# Patient Record
Sex: Male | Born: 1980 | Race: White | Hispanic: No | Marital: Married | Smoking: Never smoker
Health system: Southern US, Community
[De-identification: ages and names within clinical notes are randomized; demographics above are authoritative.]

## PROBLEM LIST (undated history)

## (undated) DIAGNOSIS — Z87442 Personal history of urinary calculi: Secondary | ICD-10-CM

## (undated) DIAGNOSIS — D369 Benign neoplasm, unspecified site: Secondary | ICD-10-CM

## (undated) DIAGNOSIS — F909 Attention-deficit hyperactivity disorder, unspecified type: Secondary | ICD-10-CM

## (undated) DIAGNOSIS — N529 Male erectile dysfunction, unspecified: Secondary | ICD-10-CM

## (undated) DIAGNOSIS — G4730 Sleep apnea, unspecified: Secondary | ICD-10-CM

## (undated) DIAGNOSIS — F32A Depression, unspecified: Secondary | ICD-10-CM

## (undated) DIAGNOSIS — I10 Essential (primary) hypertension: Secondary | ICD-10-CM

## (undated) DIAGNOSIS — M545 Low back pain: Secondary | ICD-10-CM

## (undated) DIAGNOSIS — J309 Allergic rhinitis, unspecified: Secondary | ICD-10-CM

## (undated) DIAGNOSIS — F319 Bipolar disorder, unspecified: Secondary | ICD-10-CM

## (undated) DIAGNOSIS — M5459 Other low back pain: Secondary | ICD-10-CM

## (undated) DIAGNOSIS — K5790 Diverticulosis of intestine, part unspecified, without perforation or abscess without bleeding: Secondary | ICD-10-CM

## (undated) DIAGNOSIS — F329 Major depressive disorder, single episode, unspecified: Secondary | ICD-10-CM

## (undated) DIAGNOSIS — N400 Enlarged prostate without lower urinary tract symptoms: Secondary | ICD-10-CM

## (undated) DIAGNOSIS — B019 Varicella without complication: Secondary | ICD-10-CM

## (undated) DIAGNOSIS — N200 Calculus of kidney: Secondary | ICD-10-CM

## (undated) DIAGNOSIS — K635 Polyp of colon: Secondary | ICD-10-CM

## (undated) DIAGNOSIS — K219 Gastro-esophageal reflux disease without esophagitis: Secondary | ICD-10-CM

## (undated) DIAGNOSIS — K449 Diaphragmatic hernia without obstruction or gangrene: Secondary | ICD-10-CM

## (undated) DIAGNOSIS — K649 Unspecified hemorrhoids: Secondary | ICD-10-CM

## (undated) DIAGNOSIS — F419 Anxiety disorder, unspecified: Secondary | ICD-10-CM

## (undated) HISTORY — DX: Polyp of colon: K63.5

## (undated) HISTORY — DX: Depression, unspecified: F32.A

## (undated) HISTORY — DX: Diaphragmatic hernia without obstruction or gangrene: K44.9

## (undated) HISTORY — DX: Anxiety disorder, unspecified: F41.9

## (undated) HISTORY — DX: Gastro-esophageal reflux disease without esophagitis: K21.9

## (undated) HISTORY — DX: Benign prostatic hyperplasia without lower urinary tract symptoms: N40.0

## (undated) HISTORY — DX: Benign neoplasm, unspecified site: D36.9

## (undated) HISTORY — DX: Essential (primary) hypertension: I10

## (undated) HISTORY — DX: Varicella without complication: B01.9

## (undated) HISTORY — DX: Unspecified hemorrhoids: K64.9

## (undated) HISTORY — DX: Bipolar disorder, unspecified: F31.9

## (undated) HISTORY — DX: Sleep apnea, unspecified: G47.30

## (undated) HISTORY — DX: Diverticulosis of intestine, part unspecified, without perforation or abscess without bleeding: K57.90

## (undated) HISTORY — DX: Low back pain: M54.5

## (undated) HISTORY — DX: Major depressive disorder, single episode, unspecified: F32.9

## (undated) HISTORY — PX: COLONOSCOPY: SHX174

## (undated) HISTORY — DX: Calculus of kidney: N20.0

## (undated) HISTORY — PX: OTHER SURGICAL HISTORY: SHX169

## (undated) HISTORY — DX: Male erectile dysfunction, unspecified: N52.9

## (undated) HISTORY — DX: Other low back pain: M54.59

## (undated) HISTORY — PX: TONSILLECTOMY: SUR1361

---

## 2004-12-13 ENCOUNTER — Emergency Department: Payer: Self-pay | Admitting: Internal Medicine

## 2006-05-16 ENCOUNTER — Encounter: Admission: RE | Admit: 2006-05-16 | Discharge: 2006-05-16 | Payer: Self-pay | Admitting: Gastroenterology

## 2007-03-12 ENCOUNTER — Ambulatory Visit: Payer: Self-pay | Admitting: Family Medicine

## 2012-08-19 ENCOUNTER — Ambulatory Visit: Payer: Self-pay | Admitting: Urology

## 2012-08-31 ENCOUNTER — Ambulatory Visit: Payer: Self-pay | Admitting: Urology

## 2012-09-02 ENCOUNTER — Ambulatory Visit: Payer: Self-pay | Admitting: Urology

## 2014-04-08 ENCOUNTER — Ambulatory Visit: Payer: Self-pay | Admitting: Family Medicine

## 2014-10-21 NOTE — Op Note (Signed)
PATIENT NAME:  Robert Carson, Robert Carson MR#:  [id number] DATE OF BIRTH:  June 27, 1981  DATE OF PROCEDURE:  09/02/2012  PREOPERATIVE DIAGNOSIS: Lower urinary tract symptoms.   POSTOPERATIVE DIAGNOSIS: Lower urinary tract symptoms.    PROCEDURE: Cystoscopy.   SURGEON: John Giovanni, MD   ASSISTANT: None.   ANESTHESIA: General.   INDICATIONS: The patient is a 34 year old male with bothersome lower urinary tract symptoms, primarily urinary hesitancy. He had no significant improvement with alpha blockade. He presents for cystoscopy to evaluate for the possibility of urethral stricture and requested this be performed under anesthesia.   DESCRIPTION OF PROCEDURE: He was taken to the operating room where a general anesthetic was administered. He was placed in the low lithotomy position, and his external genitalia were prepped and draped in the usual fashion. Timeout was performed. A 21-French  cystoscope with 30 degrees lens was lubricated and passed under direct vision. The urethra was normal in caliber without stricture. The prostate was nonocclusive and open with no lateral lobe enlargement noted. There were no inflammatory changes of the prostate noted. Panendoscopy was performed which was remarkable for normal-appearing bladder mucosa without erythema, solid or papillary lesions. The ureteral orifices were normal-appearing with clear efflux. The bladder was emptied and the cystoscope was removed. He was taken to the PACU in stable condition. There were no complications. EBL was 0.  ____________________________ Ronda Fairly. Bernardo Heater, MD scs:cb D: 09/02/2012 11:40:09 ET T: 09/02/2012 12:14:49 ET JOB#: [id number] cc: Nicki Reaper C. Bernardo Heater, MD, <Dictator> Abbie Sons MD ELECTRONICALLY SIGNED 09/10/2012 22:52

## 2016-10-31 ENCOUNTER — Other Ambulatory Visit: Payer: Self-pay | Admitting: *Deleted

## 2016-10-31 ENCOUNTER — Encounter: Payer: Self-pay | Admitting: *Deleted

## 2016-10-31 DIAGNOSIS — F419 Anxiety disorder, unspecified: Secondary | ICD-10-CM | POA: Insufficient documentation

## 2016-10-31 DIAGNOSIS — I10 Essential (primary) hypertension: Secondary | ICD-10-CM | POA: Insufficient documentation

## 2016-10-31 DIAGNOSIS — N200 Calculus of kidney: Secondary | ICD-10-CM | POA: Insufficient documentation

## 2016-10-31 DIAGNOSIS — K219 Gastro-esophageal reflux disease without esophagitis: Secondary | ICD-10-CM | POA: Insufficient documentation

## 2016-10-31 DIAGNOSIS — F329 Major depressive disorder, single episode, unspecified: Secondary | ICD-10-CM | POA: Insufficient documentation

## 2016-10-31 DIAGNOSIS — K449 Diaphragmatic hernia without obstruction or gangrene: Secondary | ICD-10-CM | POA: Insufficient documentation

## 2016-10-31 DIAGNOSIS — F32A Depression, unspecified: Secondary | ICD-10-CM | POA: Insufficient documentation

## 2016-11-03 NOTE — Progress Notes (Signed)
[facility] Consultation      Assessment and Plan:  The patient is 36 year old male with recurrent episodes of bronchitis, possibly secondary to aspiration.  Dysphagia. -Patient notes dysphagia to solids with choking episodes, this may be contributing to recurrent episodes of bronchitis. -He is referred back to his gastroenterologist for evaluation and possible EGD.  Acute bronchitis. -Currently asymptomatic. I suspect that his recurrent episodes of bronchitis may be secondary to above.  Hiatal hernia. -Continue PPI regularly.  Excessive daytime sleepiness with snoring. -Discussed possibility of sleep apnea, he has apparently had 2 negative sleep studies in the past, will consider repeat sleep study in the future if symptoms persist.  Date: 11/03/2016  MRN# [id number] LUIS NICKLES December 19, 1980  Referring Physician:   QUASHAUN LAZALDE is a 36 y.o. old male seen in consultation for chief complaint of:    Chief Complaint  Patient presents with  . Advice Only    Former Publishing rights manager patien: Sob, cough,pneumonia    HPI:   The patient is a 36 yo male, he has been seeing Dr. Raul Del. On review of outside records, it would appear that he had an episode of pneumonia which was seen on CXR, this resolved on Dr. Gust Brooms note in October of 2015, however he had a residual cough which was treated with breo and albuterol prn.   He notes that he gets episodes of cough a few times per year, he usually gets better with prednisone and/or abx. He does not find that there is any variation with seasons. He does note that it is worse with eating. He developed a pneumonia this past April as well as last year.   He does note that when he eats he feels that food gets trapped in his throat a lot, this can happen on a daily basis. This is worse when eating bread. He notes that he has to chew things well to make them go down and then has to drink a lot of water.  Several years ago he would have  stomach pain and possible IBS, he had a small bowel follow through, which showed small hiatal hernia without delay.  He had colonscopy on October 2015 and a polyp was found and he was recommended to have a repeat for monitoring.   He has been on inhaler in the past, he was on advair which helped with wheezing when he was having an episode.  He has had in in-lab and at [facility], which he tells me were negative.    He notes that he is sleepy during the day, he snores at night, and wakes in the am feeling tired and not rested.    PMHX:   Past Medical History:  Diagnosis Date  . Anxiety   . Bipolar 1 disorder ([place])   . Chickenpox   . Colon polyp   . Depression   . Diverticulosis   . Erectile dysfunction   . GERD (gastroesophageal reflux disease)   . Hemorrhoids   . Hiatal hernia   . Hypertension   . Kidney stones   . Mechanical low back pain   . Mild sleep apnea   . Tubular adenoma    Surgical Hx:  Past Surgical History:  Procedure Laterality Date  . COLONOSCOPY    . cystoscopy    . TONSILLECTOMY     Family Hx:  Family History  Problem Relation Age of Onset  . Skin cancer Mother   . GER disease Mother   . Skin cancer  Father    Social Hx:   Social History  Substance Use Topics  . Smoking status: Never Smoker  . Smokeless tobacco: Never Used  . Alcohol use No   Medication:   Reviewed;    Allergies:  Patient has no allergy information on record.  Review of Systems: Gen:  Denies  fever, sweats, chills HEENT: Denies blurred vision, double vision. bleeds, sore throat Cvc:  No dizziness, chest pain. Resp:   Denies cough or sputum production, shortness of breath Gi: Denies swallowing difficulty, stomach pain. Gu:  Denies bladder incontinence, burning urine Ext:   No Joint pain, stiffness. Skin: No skin rash,  hives  Endoc:  No polyuria, polydipsia. Psych: No depression, insomnia. Other:  All other systems were reviewed with the patient and were negative other that  what is mentioned in the HPI.   Physical Examination:   VS: BP 122/82 (BP Location: Left Arm, Patient Position: Sitting, Cuff Size: Normal)   Pulse 76   Resp 16   Ht 5\' 7"  (1.702 m)   Wt 210 lb (95.3 kg)   SpO2 100%   BMI 32.89 kg/m   General Appearance: No distress  Neuro:without focal findings,  speech normal,  HEENT: PERRLA, EOM intact.  [place] 3.  Pulmonary: normal breath sounds, No wheezing.  CardiovascularNormal S1,S2.  No m/r/g.   Abdomen: Benign, Soft, non-tender. Renal:  No costovertebral tenderness  GU:  No performed at this time. Endoc: No evident thyromegaly, no signs of acromegaly. Skin:   warm, no rashes, no ecchymosis  Extremities: normal, no cyanosis, clubbing.  Other findings:    LABORATORY PANEL:   CBC No results for input(s): WBC, HGB, HCT, PLT in the last 168 hours. ------------------------------------------------------------------------------------------------------------------  Chemistries  No results for input(s): NA, K, CL, CO2, GLUCOSE, BUN, CREATININE, CALCIUM, MG, AST, ALT, ALKPHOS, BILITOT in the last 168 hours.  Invalid input(s): GFRCGP ------------------------------------------------------------------------------------------------------------------  Cardiac Enzymes No results for input(s): TROPONINI in the last 168 hours. ------------------------------------------------------------  RADIOLOGY:  No results found.     Thank  you for the consultation and for allowing [facility] to assist in the care of your patient. Our recommendations are noted above.  Please contact us if we can be of further service.   Marda Stalker, MD.  Board Certified in Internal Medicine, Pulmonary Medicine, [place], and Sleep Medicine.  [facility] Office Number: [phone]  Patricia Pesa, M.D.  Merton Border, M.D  11/03/2016

## 2016-11-05 ENCOUNTER — Ambulatory Visit
Admission: RE | Admit: 2016-11-05 | Discharge: 2016-11-05 | Disposition: A | Discharge status: Home / Self Care | Payer: BLUE CROSS/BLUE SHIELD | Source: Ambulatory Visit | Attending: Internal Medicine | Admitting: Internal Medicine

## 2016-11-05 ENCOUNTER — Ambulatory Visit (INDEPENDENT_AMBULATORY_CARE_PROVIDER_SITE_OTHER): Payer: BLUE CROSS/BLUE SHIELD | Admitting: Internal Medicine

## 2016-11-05 ENCOUNTER — Other Ambulatory Visit
Admission: RE | Admit: 2016-11-05 | Discharge: 2016-11-05 | Disposition: A | Discharge status: Home / Self Care | Payer: BLUE CROSS/BLUE SHIELD | Source: Ambulatory Visit | Attending: Internal Medicine | Admitting: Internal Medicine

## 2016-11-05 ENCOUNTER — Encounter: Payer: Self-pay | Admitting: Internal Medicine

## 2016-11-05 VITALS — BP 122/82 | HR 76 | Resp 16 | Ht 67.0 in | Wt 210.0 lb

## 2016-11-05 DIAGNOSIS — R1310 Dysphagia, unspecified: Secondary | ICD-10-CM | POA: Diagnosis not present

## 2016-11-05 DIAGNOSIS — R05 Cough: Secondary | ICD-10-CM | POA: Diagnosis present

## 2016-11-05 DIAGNOSIS — J690 Pneumonitis due to inhalation of food and vomit: Secondary | ICD-10-CM

## 2016-11-05 DIAGNOSIS — R1319 Other dysphagia: Secondary | ICD-10-CM

## 2016-11-05 NOTE — Addendum Note (Signed)
Addended by: Devona Konig on: 11/05/2016 10:22 AM   Modules accepted: Orders

## 2016-11-05 NOTE — Patient Instructions (Addendum)
--  Will check serum immunoglobulins (IgG, IgM)  --Chest Xray.   --Refer to gastroenterology for dysphagia.

## 2016-11-06 LAB — MISC LABCORP TEST (SEND OUT)
LabCorp test name: 4
Labcorp test code: 209601

## 2016-11-06 LAB — IGM: IgM, Serum: 84 mg/dL (ref 20–172)

## 2016-12-10 ENCOUNTER — Other Ambulatory Visit: Payer: Self-pay | Admitting: Gastroenterology

## 2016-12-10 DIAGNOSIS — R1310 Dysphagia, unspecified: Secondary | ICD-10-CM

## 2016-12-13 ENCOUNTER — Ambulatory Visit: Payer: BLUE CROSS/BLUE SHIELD

## 2017-02-12 ENCOUNTER — Ambulatory Visit
Admission: RE | Admit: 2017-02-12 | Discharge: 2017-02-12 | Disposition: A | Discharge status: Home / Self Care | Payer: BLUE CROSS/BLUE SHIELD | Source: Ambulatory Visit | Attending: Gastroenterology | Admitting: Gastroenterology

## 2017-02-12 DIAGNOSIS — K449 Diaphragmatic hernia without obstruction or gangrene: Secondary | ICD-10-CM | POA: Insufficient documentation

## 2017-02-12 DIAGNOSIS — R1310 Dysphagia, unspecified: Secondary | ICD-10-CM | POA: Diagnosis present

## 2017-06-13 ENCOUNTER — Encounter: Payer: Self-pay | Admitting: *Deleted

## 2017-06-13 ENCOUNTER — Encounter
Admission: RE | Disposition: A | Discharge status: Home / Self Care | Payer: Self-pay | Source: Ambulatory Visit | Attending: Gastroenterology

## 2017-06-13 ENCOUNTER — Ambulatory Visit: Payer: BLUE CROSS/BLUE SHIELD | Admitting: Anesthesiology

## 2017-06-13 ENCOUNTER — Ambulatory Visit
Admission: RE | Admit: 2017-06-13 | Discharge: 2017-06-13 | Disposition: A | Discharge status: Home / Self Care | Payer: BLUE CROSS/BLUE SHIELD | Source: Ambulatory Visit | Attending: Gastroenterology | Admitting: Gastroenterology

## 2017-06-13 DIAGNOSIS — K449 Diaphragmatic hernia without obstruction or gangrene: Secondary | ICD-10-CM | POA: Diagnosis not present

## 2017-06-13 DIAGNOSIS — K5790 Diverticulosis of intestine, part unspecified, without perforation or abscess without bleeding: Secondary | ICD-10-CM | POA: Insufficient documentation

## 2017-06-13 DIAGNOSIS — I10 Essential (primary) hypertension: Secondary | ICD-10-CM | POA: Diagnosis not present

## 2017-06-13 DIAGNOSIS — F319 Bipolar disorder, unspecified: Secondary | ICD-10-CM | POA: Insufficient documentation

## 2017-06-13 DIAGNOSIS — R1310 Dysphagia, unspecified: Secondary | ICD-10-CM | POA: Diagnosis not present

## 2017-06-13 DIAGNOSIS — G4730 Sleep apnea, unspecified: Secondary | ICD-10-CM | POA: Diagnosis not present

## 2017-06-13 DIAGNOSIS — K2210 Ulcer of esophagus without bleeding: Secondary | ICD-10-CM | POA: Diagnosis not present

## 2017-06-13 DIAGNOSIS — Z539 Procedure and treatment not carried out, unspecified reason: Secondary | ICD-10-CM | POA: Diagnosis not present

## 2017-06-13 DIAGNOSIS — Z87442 Personal history of urinary calculi: Secondary | ICD-10-CM | POA: Insufficient documentation

## 2017-06-13 DIAGNOSIS — F419 Anxiety disorder, unspecified: Secondary | ICD-10-CM | POA: Diagnosis not present

## 2017-06-13 DIAGNOSIS — E669 Obesity, unspecified: Secondary | ICD-10-CM | POA: Diagnosis not present

## 2017-06-13 DIAGNOSIS — Z6835 Body mass index (BMI) 35.0-35.9, adult: Secondary | ICD-10-CM | POA: Diagnosis not present

## 2017-06-13 DIAGNOSIS — K210 Gastro-esophageal reflux disease with esophagitis: Secondary | ICD-10-CM | POA: Diagnosis not present

## 2017-06-13 DIAGNOSIS — Z86010 Personal history of colonic polyps: Secondary | ICD-10-CM | POA: Insufficient documentation

## 2017-06-13 DIAGNOSIS — Z79899 Other long term (current) drug therapy: Secondary | ICD-10-CM | POA: Diagnosis not present

## 2017-06-13 DIAGNOSIS — M545 Low back pain: Secondary | ICD-10-CM | POA: Diagnosis not present

## 2017-06-13 DIAGNOSIS — Z1211 Encounter for screening for malignant neoplasm of colon: Secondary | ICD-10-CM | POA: Diagnosis not present

## 2017-06-13 HISTORY — PX: ESOPHAGOGASTRODUODENOSCOPY (EGD) WITH PROPOFOL: SHX5813

## 2017-06-13 HISTORY — PX: COLONOSCOPY WITH PROPOFOL: SHX5780

## 2017-06-13 SURGERY — COLONOSCOPY WITH PROPOFOL
Anesthesia: General

## 2017-06-13 MED ORDER — SODIUM CHLORIDE 0.9 % IV SOLN
INTRAVENOUS | Status: DC
  Administered 2017-06-13: 10:00:00 via INTRAVENOUS

## 2017-06-13 MED ORDER — FENTANYL CITRATE (PF) 100 MCG/2ML IJ SOLN
INTRAMUSCULAR | Status: AC
  Filled 2017-06-13: qty 2

## 2017-06-13 MED ORDER — LIDOCAINE HCL (PF) 2 % IJ SOLN
INTRAMUSCULAR | Status: DC | PRN
  Administered 2017-06-13: 100 mg

## 2017-06-13 MED ORDER — SODIUM CHLORIDE 0.9 % IV SOLN: INTRAVENOUS | Status: DC

## 2017-06-13 MED ORDER — GLYCOPYRROLATE 0.2 MG/ML IJ SOLN
INTRAMUSCULAR | Status: DC | PRN
  Administered 2017-06-13: 0.2 mg via INTRAVENOUS

## 2017-06-13 MED ORDER — FENTANYL CITRATE (PF) 100 MCG/2ML IJ SOLN
INTRAMUSCULAR | Status: DC | PRN
  Administered 2017-06-13 (×2): 50 ug via INTRAVENOUS

## 2017-06-13 MED ORDER — MIDAZOLAM HCL 5 MG/5ML IJ SOLN
INTRAMUSCULAR | Status: DC | PRN
  Administered 2017-06-13 (×2): 2 mg via INTRAVENOUS

## 2017-06-13 MED ORDER — MIDAZOLAM HCL 2 MG/2ML IJ SOLN
INTRAMUSCULAR | Status: AC
  Filled 2017-06-13: qty 2

## 2017-06-13 MED ORDER — PROPOFOL 500 MG/50ML IV EMUL
INTRAVENOUS | Status: AC
  Filled 2017-06-13: qty 50

## 2017-06-13 MED ORDER — LIDOCAINE HCL (PF) 2 % IJ SOLN
INTRAMUSCULAR | Status: AC
  Filled 2017-06-13: qty 10

## 2017-06-13 MED ORDER — PROPOFOL 10 MG/ML IV BOLUS
INTRAVENOUS | Status: DC | PRN
  Administered 2017-06-13: 20 mg via INTRAVENOUS
  Administered 2017-06-13: 30 mg via INTRAVENOUS

## 2017-06-13 MED ORDER — PROPOFOL 500 MG/50ML IV EMUL
INTRAVENOUS | Status: DC | PRN
  Administered 2017-06-13: 50 ug/kg/min via INTRAVENOUS

## 2017-06-13 MED ORDER — GLYCOPYRROLATE 0.2 MG/ML IJ SOLN
INTRAMUSCULAR | Status: AC
  Filled 2017-06-13: qty 1

## 2017-06-13 NOTE — Anesthesia Postprocedure Evaluation (Signed)
Anesthesia Post Note  Patient: Robert Carson  Procedure(s) Performed: COLONOSCOPY WITH PROPOFOL (N/A ) ESOPHAGOGASTRODUODENOSCOPY (EGD) WITH PROPOFOL (N/A )  Patient location during evaluation: Endoscopy Anesthesia Type: General Level of consciousness: awake and alert and oriented Pain management: pain level controlled Vital Signs Assessment: post-procedure vital signs reviewed and stable Respiratory status: spontaneous breathing, nonlabored ventilation and respiratory function stable Cardiovascular status: blood pressure returned to baseline and stable Postop Assessment: no signs of nausea or vomiting Anesthetic complications: no     Last Vitals:  Vitals:   06/13/17 1046 06/13/17 1056  BP: 128/80 121/83  Pulse: 78 78  Resp: 15 20  Temp:    SpO2: 95% 94%    Last Pain:  Vitals:   06/13/17 1026  TempSrc: Tympanic                 Jerriann Schrom

## 2017-06-13 NOTE — Anesthesia Post-op Follow-up Note (Signed)
Anesthesia QCDR form completed.        

## 2017-06-13 NOTE — Anesthesia Preprocedure Evaluation (Signed)
Anesthesia Evaluation  Patient identified by MRN, date of birth, ID band Patient awake    Reviewed: Allergy & Precautions, NPO status , Patient's Chart, lab work & pertinent test results  History of Anesthesia Complications Negative for: history of anesthetic complications  Airway Mallampati: III  TM Distance: >3 FB Neck ROM: Full    Dental no notable dental hx.    Pulmonary neg sleep apnea, neg COPD,    breath sounds clear to auscultation- rhonchi (-) wheezing      Cardiovascular hypertension, Pt. on medications (-) CAD, (-) Past MI and (-) Cardiac Stents  Rhythm:Regular Rate:Normal - Systolic murmurs and - Diastolic murmurs    Neuro/Psych PSYCHIATRIC DISORDERS Anxiety Depression Bipolar Disorder negative neurological ROS     GI/Hepatic Neg liver ROS, hiatal hernia, GERD  ,  Endo/Other  negative endocrine ROSneg diabetes  Renal/GU Renal disease: hx of nephrolithiasis.     Musculoskeletal negative musculoskeletal ROS (+)   Abdominal (+) + obese,   Peds  Hematology negative hematology ROS (+)   Anesthesia Other Findings Past Medical History: No date: Anxiety No date: Bipolar 1 disorder (HCC) No date: Chickenpox No date: Colon polyp No date: Depression No date: Diverticulosis No date: Erectile dysfunction No date: GERD (gastroesophageal reflux disease) No date: Hemorrhoids No date: Hiatal hernia No date: Hypertension No date: Kidney stones No date: Mechanical low back pain No date: Mild sleep apnea No date: Tubular adenoma   Reproductive/Obstetrics                             Anesthesia Physical Anesthesia Plan  ASA: II  Anesthesia Plan: General   Post-op Pain Management:    Induction: Intravenous  PONV Risk Score and Plan: 1 and Propofol infusion  Airway Management Planned: Natural Airway  Additional Equipment:   Intra-op Plan:   Post-operative Plan:   Informed  Consent: I have reviewed the patients History and Physical, chart, labs and discussed the procedure including the risks, benefits and alternatives for the proposed anesthesia with the patient or authorized representative who has indicated his/her understanding and acceptance.   Dental advisory given  Plan Discussed with: CRNA and Anesthesiologist  Anesthesia Plan Comments:         Anesthesia Quick Evaluation

## 2017-06-13 NOTE — H&P (Signed)
Outpatient short stay form Pre-procedure 06/13/2017 9:43 AM [name] MD  Primary Physician: Jackolyn Confer MD  Reason for visit:  EGD and colonoscopy  History of present illness:  Patient is a 36 year old male presenting today as above. He has a personal history of adenomatous colon polyps and has been having some issues with cervical dysphagia. A barium swallow was done that showed a very early diverticulum at the level of the cricopharyngeus. Esophagus was otherwise normal she he does have a small hiatal hernia. He also passes barium tablet without restriction. He tolerated his prep well. He takes no aspirin or blood thinning agents. He does take omeprazole daily.    Current Facility-Administered Medications:  .  0.9 %  sodium chloride infusion, , Intravenous, Continuous, [name], MD .  0.9 %  sodium chloride infusion, , Intravenous, Continuous, [name], MD  Medications Prior to Admission  Medication Sig Dispense Refill Last Dose  . amLODipine (NORVASC) 5 MG tablet Take 5 mg by mouth daily.   06/12/2017 at Unknown time  . cetirizine (ZYRTEC) 10 MG tablet Take 10 mg by mouth daily.   05/30/2017  . guanFACINE (INTUNIV) 4 MG TB24 ER tablet Take 4 mg by mouth at bedtime.  1 06/13/2017 at Unknown time  . lamoTRIgine (LAMICTAL) 150 MG tablet Take 150 mg by mouth 2 (two) times daily.   06/13/2017 at Unknown time  . Melatonin 10 MG TABS Take 1 tablet by mouth at bedtime as needed.   Past Month at Unknown time  . Multiple Vitamin (MULTIVITAMIN) tablet Take 1 tablet by mouth daily.   Past Week at Unknown time  . omeprazole (PRILOSEC) 20 MG capsule Take 20 mg by mouth daily.   06/13/2017 at Unknown time  . QUEtiapine (SEROQUEL) 200 MG tablet Take 200 mg by mouth at bedtime.   06/12/2017 at 2100     No Known Allergies   Past Medical History:  Diagnosis Date  . Anxiety   . Bipolar 1 disorder ([place])   . Chickenpox   . Colon polyp   . Depression   .  Diverticulosis   . Erectile dysfunction   . GERD (gastroesophageal reflux disease)   . Hemorrhoids   . Hiatal hernia   . Hypertension   . Kidney stones   . Mechanical low back pain   . Mild sleep apnea   . Tubular adenoma     Review of systems:      Physical Exam    Heart and lungs: Regular rate and rhythm without rub or gallop, lungs are bilaterally clear.    HEENT: Normocephalic atraumatic eyes are anicteric    Other:     Pertinant exam for procedure: Soft nontender nondistended bowel sounds positive normoactive.    Planned proceedures: EGD, colonoscopy and indicated procedures. I have discussed the risks benefits and complications of procedures to include not limited to bleeding, infection, perforation and the risk of sedation and the patient wishes to proceed.    [name], MD Gastroenterology 06/13/2017  9:43 AM

## 2017-06-13 NOTE — Op Note (Signed)
[facility] Gastroenterology Patient Name: Robert Carson Procedure Date: 06/13/2017 9:44 AM MRN: [id number] Account #: [id number] Date of Birth: 09/13/1980 Admit Type: Outpatient Age: 36 Room: [facility] ENDO ROOM 3 Gender: Male Note Status: Finalized Procedure:            Colonoscopy Indications:          Personal history of colonic polyps Providers:            [name], MD Referring MD:         Irven Easterly. Kary Kos, MD (Referring MD) Medicines:            Monitored Anesthesia Care Complications:        No immediate complications. Procedure:            Pre-Anesthesia Assessment:                       - ASA Grade Assessment: II - A patient with mild                        systemic disease.                       After obtaining informed consent, the colonoscope was                        passed under direct vision. Throughout the procedure,                        the patient's blood pressure, pulse, and oxygen                        saturations were monitored continuously. The Olympus                        PCF-H180AL colonoscope ( S#: [id number] ) was introduced                        through the anus with the intention of advancing to the                        cecum. The scope was advanced to the sigmoid colon                        before the procedure was aborted. Medications were                        given. The colonoscopy was extremely difficult due to                        poor bowel prep. Findings:      A large amount of semi-liquid stool was found in the rectum and in the       sigmoid colon, precluding visualization. Impression:           - Stool in the rectum and in the sigmoid colon.                       - No specimens collected. Recommendation:       - Discharge patient to home.                       -  Reschedule and reprep. Procedure Code(s):    --- Professional ---                       [phone], 23, Colonoscopy, flexible; diagnostic, including                       collection of specimen(s) by brushing or washing, when                        performed (separate procedure) Diagnosis Code(s):    --- Professional ---                       Z86.010, Personal history of colonic polyps CPT copyright 2016 American Medical Association. All rights reserved. The codes documented in this report are preliminary and upon coder review may  be revised to meet current compliance requirements. [name], MD 06/13/2017 10:25:38 AM This report has been signed electronically. Number of Addenda: 0 Note Initiated On: 06/13/2017 9:44 AM Total Procedure Duration: 0 hours 2 minutes 48 seconds       [facility]

## 2017-06-13 NOTE — Op Note (Signed)
[facility] Gastroenterology Patient Name: Robert Carson Procedure Date: 06/13/2017 9:44 AM MRN: [id number] Account #: [id number] Date of Birth: [birth year] Admit Type: Outpatient Age: 36 Room: [facility] ENDO ROOM 3 Gender: Male Note Status: Finalized Procedure:            Upper GI endoscopy Indications:          Dysphagia Providers:            [name], MD Referring MD:         Irven Easterly. Kary Kos, MD (Referring MD) Medicines:            Monitored Anesthesia Care Complications:        No immediate complications. Procedure:            Pre-Anesthesia Assessment:                       - ASA Grade Assessment: II - A patient with mild                        systemic disease.                       After obtaining informed consent, the endoscope was                        passed under direct vision. Throughout the procedure,                        the patient's blood pressure, pulse, and oxygen                        saturations were monitored continuously. The Endoscope                        was introduced through the mouth, and advanced to the                        third part of duodenum. The upper GI endoscopy was                        accomplished without difficulty. The patient tolerated                        the procedure well. Findings:      LA Grade B (one or more mucosal breaks greater than 5 mm, not extending       between the tops of two mucosal folds) esophagitis with no bleeding was       found. Biopsies were taken with a cold forceps for histology.      A small hiatal hernia was found. The Z-line was a variable distance from       incisors; the hiatal hernia was sliding.      The exam of the esophagus was otherwise normal.      The entire examined stomach was normal.      The cardia and gastric fundus were normal on retroflexion.      The examined duodenum was normal. Impression:           - LA Grade B erosive esophagitis. Biopsied.         - Small hiatal hernia.                       -  Normal stomach.                       - Normal examined duodenum. Recommendation:       - Use Protonix (pantoprazole) 40 mg PO daily daily.                       - Return to GI clinic in 4 weeks. Procedure Code(s):    --- Professional ---                       [phone], Esophagogastroduodenoscopy, flexible, transoral;                        with biopsy, single or multiple Diagnosis Code(s):    --- Professional ---                       K20.8, Other esophagitis                       K44.9, Diaphragmatic hernia without obstruction or                        gangrene                       R13.10, Dysphagia, unspecified CPT copyright 2016 American Medical Association. All rights reserved. The codes documented in this report are preliminary and upon coder review may  be revised to meet current compliance requirements. [name], MD 06/13/2017 10:16:33 AM This report has been signed electronically. Number of Addenda: 0 Note Initiated On: 06/13/2017 9:44 AM      [facility]

## 2017-06-13 NOTE — Transfer of Care (Signed)
Immediate Anesthesia Transfer of Care Note  Patient: Robert Carson  Procedure(s) Performed: COLONOSCOPY WITH PROPOFOL (N/A ) ESOPHAGOGASTRODUODENOSCOPY (EGD) WITH PROPOFOL (N/A )  Patient Location: PACU  Anesthesia Type:General  Level of Consciousness: sedated  Airway & Oxygen Therapy: Patient Spontanous Breathing and Patient connected to nasal cannula oxygen  Post-op Assessment: Report given to RN and Post -op Vital signs reviewed and stable  Post vital signs: Reviewed and stable  Last Vitals:  Vitals:   06/13/17 0855 06/13/17 1026  BP: (!) 140/95 113/71  Pulse: 81 75  Resp: 14 13  Temp: (!) 36.3 C (!) 35.7 C  SpO2:  94%    Last Pain:  Vitals:   06/13/17 1026  TempSrc: Tympanic         Complications: No apparent anesthesia complications

## 2017-06-16 LAB — SURGICAL PATHOLOGY

## 2017-06-17 ENCOUNTER — Encounter: Payer: Self-pay | Admitting: Gastroenterology

## 2017-06-19 ENCOUNTER — Ambulatory Visit
Admission: RE | Admit: 2017-06-19 | Payer: BLUE CROSS/BLUE SHIELD | Source: Ambulatory Visit | Admitting: Gastroenterology

## 2017-06-19 ENCOUNTER — Encounter: Admission: RE | Payer: Self-pay | Source: Ambulatory Visit

## 2017-06-19 SURGERY — COLONOSCOPY WITH PROPOFOL
Anesthesia: General

## 2017-07-18 ENCOUNTER — Telehealth: Payer: Self-pay | Admitting: Internal Medicine

## 2017-07-18 NOTE — Telephone Encounter (Signed)
Patient calling in regards to recall letter States he does not feel appt is necessary and has not been having any symptoms States he will call if issues occur  Deleting recall

## 2017-12-08 ENCOUNTER — Ambulatory Visit: Payer: BLUE CROSS/BLUE SHIELD | Admitting: Anesthesiology

## 2017-12-08 ENCOUNTER — Encounter: Payer: Self-pay | Admitting: *Deleted

## 2017-12-08 ENCOUNTER — Encounter
Admission: RE | Disposition: A | Discharge status: Home / Self Care | Payer: Self-pay | Source: Ambulatory Visit | Attending: Gastroenterology

## 2017-12-08 ENCOUNTER — Ambulatory Visit
Admission: RE | Admit: 2017-12-08 | Discharge: 2017-12-08 | Disposition: A | Discharge status: Home / Self Care | Payer: BLUE CROSS/BLUE SHIELD | Source: Ambulatory Visit | Attending: Gastroenterology | Admitting: Gastroenterology

## 2017-12-08 DIAGNOSIS — Z7984 Long term (current) use of oral hypoglycemic drugs: Secondary | ICD-10-CM | POA: Insufficient documentation

## 2017-12-08 DIAGNOSIS — D124 Benign neoplasm of descending colon: Secondary | ICD-10-CM | POA: Insufficient documentation

## 2017-12-08 DIAGNOSIS — Z87442 Personal history of urinary calculi: Secondary | ICD-10-CM | POA: Diagnosis not present

## 2017-12-08 DIAGNOSIS — Z888 Allergy status to other drugs, medicaments and biological substances status: Secondary | ICD-10-CM | POA: Insufficient documentation

## 2017-12-08 DIAGNOSIS — K580 Irritable bowel syndrome with diarrhea: Secondary | ICD-10-CM | POA: Diagnosis not present

## 2017-12-08 DIAGNOSIS — F419 Anxiety disorder, unspecified: Secondary | ICD-10-CM | POA: Insufficient documentation

## 2017-12-08 DIAGNOSIS — I10 Essential (primary) hypertension: Secondary | ICD-10-CM | POA: Insufficient documentation

## 2017-12-08 DIAGNOSIS — G4730 Sleep apnea, unspecified: Secondary | ICD-10-CM | POA: Diagnosis not present

## 2017-12-08 DIAGNOSIS — K449 Diaphragmatic hernia without obstruction or gangrene: Secondary | ICD-10-CM | POA: Insufficient documentation

## 2017-12-08 DIAGNOSIS — Z881 Allergy status to other antibiotic agents status: Secondary | ICD-10-CM | POA: Insufficient documentation

## 2017-12-08 DIAGNOSIS — K219 Gastro-esophageal reflux disease without esophagitis: Secondary | ICD-10-CM | POA: Diagnosis not present

## 2017-12-08 DIAGNOSIS — Z79899 Other long term (current) drug therapy: Secondary | ICD-10-CM | POA: Insufficient documentation

## 2017-12-08 DIAGNOSIS — F319 Bipolar disorder, unspecified: Secondary | ICD-10-CM | POA: Diagnosis not present

## 2017-12-08 DIAGNOSIS — K529 Noninfective gastroenteritis and colitis, unspecified: Secondary | ICD-10-CM | POA: Diagnosis present

## 2017-12-08 DIAGNOSIS — D123 Benign neoplasm of transverse colon: Secondary | ICD-10-CM | POA: Diagnosis not present

## 2017-12-08 DIAGNOSIS — Z86010 Personal history of colonic polyps: Secondary | ICD-10-CM | POA: Insufficient documentation

## 2017-12-08 HISTORY — PX: COLONOSCOPY WITH PROPOFOL: SHX5780

## 2017-12-08 HISTORY — DX: Personal history of urinary calculi: Z87.442

## 2017-12-08 SURGERY — COLONOSCOPY WITH PROPOFOL
Anesthesia: General

## 2017-12-08 MED ORDER — EPHEDRINE SULFATE 50 MG/ML IJ SOLN
INTRAMUSCULAR | Status: AC
  Filled 2017-12-08: qty 1

## 2017-12-08 MED ORDER — FENTANYL CITRATE (PF) 100 MCG/2ML IJ SOLN
INTRAMUSCULAR | Status: DC | PRN
  Administered 2017-12-08 (×2): 50 ug via INTRAVENOUS

## 2017-12-08 MED ORDER — PROPOFOL 10 MG/ML IV BOLUS
INTRAVENOUS | Status: AC
  Filled 2017-12-08: qty 20

## 2017-12-08 MED ORDER — SODIUM CHLORIDE 0.9 % IV SOLN
INTRAVENOUS | Status: DC
  Administered 2017-12-08: 1000 mL via INTRAVENOUS

## 2017-12-08 MED ORDER — PROPOFOL 500 MG/50ML IV EMUL
INTRAVENOUS | Status: DC | PRN
Start: 2017-12-08 — End: 2017-12-08
  Administered 2017-12-08: 160 ug/kg/min via INTRAVENOUS
  Administered 2017-12-08: 140 ug/kg/min via INTRAVENOUS

## 2017-12-08 MED ORDER — SODIUM CHLORIDE 0.9 % IV SOLN
INTRAVENOUS | Status: DC
  Administered 2017-12-08: 15:00:00 via INTRAVENOUS

## 2017-12-08 MED ORDER — MIDAZOLAM HCL 2 MG/2ML IJ SOLN
INTRAMUSCULAR | Status: AC
  Filled 2017-12-08: qty 2

## 2017-12-08 MED ORDER — MIDAZOLAM HCL 2 MG/2ML IJ SOLN
INTRAMUSCULAR | Status: DC | PRN
  Administered 2017-12-08: 2 mg via INTRAVENOUS

## 2017-12-08 MED ORDER — PHENYLEPHRINE HCL 10 MG/ML IJ SOLN
INTRAMUSCULAR | Status: DC | PRN
  Administered 2017-12-08: 50 ug via INTRAVENOUS

## 2017-12-08 MED ORDER — EPHEDRINE SULFATE 50 MG/ML IJ SOLN
INTRAMUSCULAR | Status: DC | PRN
  Administered 2017-12-08 (×3): 5 mg via INTRAVENOUS

## 2017-12-08 MED ORDER — PROPOFOL 500 MG/50ML IV EMUL
INTRAVENOUS | Status: AC
  Filled 2017-12-08: qty 50

## 2017-12-08 MED ORDER — FENTANYL CITRATE (PF) 100 MCG/2ML IJ SOLN
INTRAMUSCULAR | Status: AC
  Filled 2017-12-08: qty 2

## 2017-12-08 NOTE — Anesthesia Postprocedure Evaluation (Signed)
Anesthesia Post Note  Patient: Robert Carson  Procedure(s) Performed: COLONOSCOPY WITH PROPOFOL (N/A )  Patient location during evaluation: Endoscopy Anesthesia Type: General Level of consciousness: awake and alert Pain management: pain level controlled Vital Signs Assessment: post-procedure vital signs reviewed and stable Respiratory status: spontaneous breathing, nonlabored ventilation, respiratory function stable and patient connected to nasal cannula oxygen Cardiovascular status: blood pressure returned to baseline and stable Postop Assessment: no apparent nausea or vomiting Anesthetic complications: no     Last Vitals:  Vitals:   12/08/17 1550 12/08/17 1600  BP: 100/66 118/80  Pulse: 99 84  Resp: (!) 25 20  Temp:    SpO2: 93% 92%    Last Pain:  Vitals:   12/08/17 1600  TempSrc:   PainSc: 0-No pain                 Martha Clan

## 2017-12-08 NOTE — Anesthesia Preprocedure Evaluation (Signed)
Anesthesia Evaluation  Patient identified by MRN, date of birth, ID band Patient awake    Reviewed: Allergy & Precautions, NPO status , Patient's Chart, lab work & pertinent test results  History of Anesthesia Complications Negative for: history of anesthetic complications  Airway Mallampati: III  TM Distance: >3 FB Neck ROM: Full    Dental no notable dental hx. (+) Dental Advidsory Given   Pulmonary neg sleep apnea, neg COPD,    breath sounds clear to auscultation- rhonchi (-) wheezing      Cardiovascular hypertension, Pt. on medications (-) CAD, (-) Past MI and (-) Cardiac Stents  Rhythm:Regular Rate:Normal - Systolic murmurs and - Diastolic murmurs    Neuro/Psych PSYCHIATRIC DISORDERS Anxiety Depression Bipolar Disorder negative neurological ROS     GI/Hepatic Neg liver ROS, hiatal hernia, GERD  ,  Endo/Other  negative endocrine ROSneg diabetes  Renal/GU Renal disease: hx of nephrolithiasis.     Musculoskeletal negative musculoskeletal ROS (+)   Abdominal (+) + obese,   Peds  Hematology negative hematology ROS (+)   Anesthesia Other Findings Past Medical History: No date: Anxiety No date: Bipolar 1 disorder (HCC) No date: Chickenpox No date: Colon polyp No date: Depression No date: Diverticulosis No date: Erectile dysfunction No date: GERD (gastroesophageal reflux disease) No date: Hemorrhoids No date: Hiatal hernia No date: Hypertension No date: Kidney stones No date: Mechanical low back pain No date: Mild sleep apnea No date: Tubular adenoma   Reproductive/Obstetrics                             Anesthesia Physical  Anesthesia Plan  ASA: II  Anesthesia Plan: General   Post-op Pain Management:    Induction: Intravenous  PONV Risk Score and Plan: 1 and Propofol infusion  Airway Management Planned: Natural Airway  Additional Equipment:   Intra-op Plan:    Post-operative Plan:   Informed Consent: I have reviewed the patients History and Physical, chart, labs and discussed the procedure including the risks, benefits and alternatives for the proposed anesthesia with the patient or authorized representative who has indicated his/her understanding and acceptance.   Dental advisory given  Plan Discussed with: CRNA and Anesthesiologist  Anesthesia Plan Comments:         Anesthesia Quick Evaluation

## 2017-12-08 NOTE — Anesthesia Post-op Follow-up Note (Signed)
Anesthesia QCDR form completed.        

## 2017-12-08 NOTE — Transfer of Care (Signed)
Immediate Anesthesia Transfer of Care Note  Patient: Robert Carson  Procedure(s) Performed: COLONOSCOPY WITH PROPOFOL (N/A )  Patient Location: PACU  Anesthesia Type:General  Level of Consciousness: awake and sedated  Airway & Oxygen Therapy: Patient Spontanous Breathing and Patient connected to nasal cannula oxygen  Post-op Assessment: Report given to RN and Post -op Vital signs reviewed and stable  Post vital signs: Reviewed and stable  Last Vitals:  Vitals Value Taken Time  BP    Temp    Pulse    Resp    SpO2      Last Pain:  Vitals:   12/08/17 1301  TempSrc: Tympanic  PainSc: 0-No pain      Patients Stated Pain Goal: 0 (37/44/51 [id number])  Complications: No apparent anesthesia complications

## 2017-12-08 NOTE — H&P (Signed)
Outpatient short stay form Pre-procedure 12/08/2017 2:39 PM Robert Sails MD  Primary Physician: Dr. [place] Pink  Reason for visit: Colonoscopy  History of present illness: Patient is a 37 year old male presenting today as above.  He has a personal history of a adenomatous polyp being removed on a previous colonoscopy.  Also has a history of alternating bowel habit irritable bowel syndrome with morning diarrhea. Tolerated his prep well.  Takes no aspirin or blood thinning agent.   Current Facility-Administered Medications:  .  0.9 %  sodium chloride infusion, , Intravenous, Continuous, Robert Sails, MD .  0.9 %  sodium chloride infusion, , Intravenous, Continuous, Robert Sails, MD, Last Rate: 20 mL/hr at 12/08/17 1325, 1,000 mL at 12/08/17 1325  Medications Prior to Admission  Medication Sig Dispense Refill Last Dose  . ALPRAZolam (XANAX) 0.5 MG tablet Take 0.5 mg by mouth at bedtime as needed for anxiety.     Marland Kitchen amLODipine (NORVASC) 5 MG tablet Take 5 mg by mouth daily.   12/07/2017 at Unknown time  . cetirizine (ZYRTEC) 10 MG tablet Take 10 mg by mouth daily.   Past Week at Unknown time  . guanFACINE (INTUNIV) 4 MG TB24 ER tablet Take 4 mg by mouth at bedtime.  1 12/08/2017 at Unknown time  . lamoTRIgine (LAMICTAL) 150 MG tablet Take 150 mg by mouth 2 (two) times daily.   12/08/2017 at Unknown time  . metFORMIN (GLUCOPHAGE) 1000 MG tablet Take 1,000 mg by mouth daily with breakfast.   Past Week at Unknown time  . Multiple Vitamin (MULTIVITAMIN) tablet Take 1 tablet by mouth daily.   Past Week at Unknown time  . omeprazole (PRILOSEC) 20 MG capsule Take 20 mg by mouth daily.   12/07/2017 at Unknown time  . QUEtiapine (SEROQUEL) 200 MG tablet Take 200 mg by mouth at bedtime.   12/07/2017 at Unknown time  . albuterol (PROVENTIL HFA;VENTOLIN HFA) 108 (90 Base) MCG/ACT inhaler Inhale 1-2 puffs into the lungs every 6 (six) hours as needed for wheezing or shortness of breath.   Not  Taking at Unknown time  . hydrocortisone 2.5 % cream Apply topically 2 (two) times daily.   Not Taking at Unknown time  . loratadine (CLARITIN) 10 MG tablet Take 10 mg by mouth daily.   Not Taking at Unknown time  . Melatonin 10 MG TABS Take 1 tablet by mouth at bedtime as needed.   Not Taking at Unknown time     Allergies  Allergen Reactions  . Erythromycin   . Prednisone      Past Medical History:  Diagnosis Date  . Anxiety   . Bipolar 1 disorder ([place])   . Chickenpox   . Colon polyp   . Depression   . Diverticulosis   . Erectile dysfunction   . GERD (gastroesophageal reflux disease)   . Hemorrhoids   . Hiatal hernia   . History of kidney stones   . Hypertension   . Kidney stones   . Mechanical low back pain   . Mild sleep apnea   . Tubular adenoma     Review of systems:      Physical Exam    Heart and lungs: Regular rate and rhythm without rub or gallop, lungs are bilaterally clear.    HEENT: Normocephalic atraumatic eyes are anicteric    Other:    Pertinant exam for procedure: Soft nontender nondistended bowel sounds positive normoactive    Planned proceedures: Colonoscopy and indicated procedures. I have  discussed the risks benefits and complications of procedures to include not limited to bleeding, infection, perforation and the risk of sedation and the patient wishes to proceed.    Robert Sails, MD Gastroenterology 12/08/2017  2:39 PM

## 2017-12-08 NOTE — Anesthesia Procedure Notes (Signed)
Performed by: Cook-Martin, Anabella Capshaw Pre-anesthesia Checklist: Patient identified, Emergency Drugs available, Suction available, Patient being monitored and Timeout performed Patient Re-evaluated:Patient Re-evaluated prior to induction Oxygen Delivery Method: Nasal cannula Preoxygenation: Pre-oxygenation with 100% oxygen Induction Type: IV induction Ventilation: Oral airway inserted - appropriate to patient size Placement Confirmation: positive ETCO2 and CO2 detector       

## 2017-12-08 NOTE — Op Note (Signed)
[facility] Patient Name: Robert Carson Procedure Date: 12/08/2017 2:34 PM MRN: [id number] Account #: [id number] Date of Birth: 12/03/1980 Admit Type: Outpatient Age: 37 Room: [facility] ENDO ROOM 1 Gender: Male Note Status: Finalized Procedure:            Colonoscopy Indications:          Chronic diarrhea, Personal history of colonic polyps Providers:            [name], MD Referring MD:         Irven Easterly. Kary Kos, MD (Referring MD) Medicines:            Monitored Anesthesia Care Complications:        No immediate complications. Procedure:            Pre-Anesthesia Assessment:                       - ASA Grade Assessment: II - A patient with mild                        systemic disease.                       After obtaining informed consent, the colonoscope was                        passed under direct vision. Throughout the procedure,                        the patient's blood pressure, pulse, and oxygen                        saturations were monitored continuously. The                        Colonoscope was introduced through the anus and                        advanced to the the cecum, identified by appendiceal                        orifice and ileocecal valve. The colonoscopy was                        performed without difficulty. The patient tolerated the                        procedure well. The quality of the bowel preparation                        was good. Findings:      A 4 mm polyp was found in the descending colon. The polyp was sessile.       The polyp was removed with a cold snare and cold forcep. Resection and       retrieval were complete.      A 4 mm polyp was found in the distal transverse colon. The polyp was       sessile. The polyp was removed with a cold snare. Resection and       retrieval were complete.      Biopsies for histology were taken with a cold forceps from the  right       colon and left colon for  evaluation of microscopic colitis.      The retroflexed view of the distal rectum and anal verge was normal and       showed no anal or rectal abnormalities.      The digital rectal exam was normal. Impression:           - One 4 mm polyp in the descending colon, removed with                        a cold snare. Resected and retrieved.                       - One 4 mm polyp in the distal transverse colon,                        removed with a cold snare. Resected and retrieved.                       - The distal rectum and anal verge are normal on                        retroflexion view.                       - Biopsies were taken with a cold forceps from the                        right colon and left colon for evaluation of                        microscopic colitis. Recommendation:       - Discharge patient to home.                       - Await pathology results.                       - Telephone GI clinic for pathology results in 1 week.                       - Return to GI clinic in 6 weeks.                       - Use Citrucel one tablespoon PO daily daily. Procedure Code(s):    --- Professional ---                       [phone], Colonoscopy, flexible; with removal of tumor(s),                        polyp(s), or other lesion(s) by snare technique                       45380, 59, Colonoscopy, flexible; with biopsy, single                        or multiple Diagnosis Code(s):    --- Professional ---                       D12.4, Benign  neoplasm of descending colon                       D12.3, Benign neoplasm of transverse colon (hepatic                        flexure or splenic flexure)                       K52.9, Noninfective gastroenteritis and colitis,                        unspecified                       Z86.010, Personal history of colonic polyps CPT copyright 2017 American Medical Association. All rights reserved. The codes documented in this report are preliminary and  upon coder review may  be revised to meet current compliance requirements. [name], MD 12/08/2017 3:34:58 PM This report has been signed electronically. Number of Addenda: 0 Note Initiated On: 12/08/2017 2:34 PM Scope Withdrawal Time: 0 hours 21 minutes 54 seconds  Total Procedure Duration: 0 hours 37 minutes 52 seconds       [facility]

## 2017-12-09 ENCOUNTER — Encounter: Payer: Self-pay | Admitting: Gastroenterology

## 2017-12-10 LAB — SURGICAL PATHOLOGY

## 2019-01-16 ENCOUNTER — Other Ambulatory Visit: Payer: Self-pay

## 2019-01-16 ENCOUNTER — Ambulatory Visit
Admission: EM | Admit: 2019-01-16 | Discharge: 2019-01-16 | Disposition: A | Discharge status: Home / Self Care | Payer: BC Managed Care – PPO | Attending: Family Medicine | Admitting: Family Medicine

## 2019-01-16 DIAGNOSIS — M25531 Pain in right wrist: Secondary | ICD-10-CM

## 2019-01-16 DIAGNOSIS — Y93B3 Activity, free weights: Secondary | ICD-10-CM | POA: Diagnosis not present

## 2019-01-16 MED ORDER — MELOXICAM 15 MG PO TABS: 15.0000 mg | ORAL_TABLET | Freq: Every day | ORAL | 0 refills | Status: DC | PRN

## 2019-01-16 NOTE — ED Provider Notes (Signed)
[facility]    CSN: [id number] Arrival date & time: 01/16/19  1018   History   Chief Complaint Chief Complaint  Patient presents with  . Wrist Pain   HPI  38 year old Carson presents with right wrist pain.  Patient reports that he injured his right wrist approximately 1 month ago while doing "wrist curls".  Patient states that his pain has improved but he continues to have intermittent wrist pain particularly on the ulnar side with certain activities.  When it occurs, it is approximately 5/10 in severity.  No recent medications tried.  He has been wearing a wrist brace.  No other reported symptoms.  No other complaints.  PMH, Surgical Hx, Family Hx, Social History reviewed and updated as below.  Past Medical History:  Diagnosis Date  . Anxiety   . Bipolar 1 disorder ([place])   . Chickenpox   . Colon polyp   . Depression   . Diverticulosis   . Erectile dysfunction   . GERD (gastroesophageal reflux disease)   . Hemorrhoids   . Hiatal hernia   . History of kidney stones   . Hypertension   . Kidney stones   . Mechanical low back pain   . Mild sleep apnea   . Tubular adenoma     Patient Active Problem List   Diagnosis Date Noted  . Anxiety 10/31/2016  . Depression 10/31/2016  . GERD (gastroesophageal reflux disease) 10/31/2016  . Hiatal hernia 10/31/2016  . Hypertension 10/31/2016  . Kidney stones 10/31/2016    Past Surgical History:  Procedure Laterality Date  . COLONOSCOPY    . COLONOSCOPY WITH PROPOFOL N/A 06/13/2017   Procedure: COLONOSCOPY WITH PROPOFOL;  Surgeon: [name], MD;  Location: [facility] ENDOSCOPY;  Service: Endoscopy;  Laterality: N/A;  . COLONOSCOPY WITH PROPOFOL N/A 12/08/2017   Procedure: COLONOSCOPY WITH PROPOFOL;  Surgeon: [name], MD;  Location: [facility] ENDOSCOPY;  Service: Endoscopy;  Laterality: N/A;  . cystoscopy    . ESOPHAGOGASTRODUODENOSCOPY (EGD) WITH PROPOFOL N/A 06/13/2017   Procedure: ESOPHAGOGASTRODUODENOSCOPY  (EGD) WITH PROPOFOL;  Surgeon: [name], MD;  Location[facility] ENDOSCOPY;  Service: Endoscopy;  Laterality: N/A;  . TONSILLECTOMY         Home Medications    Prior to Admission medications   Medication Sig Start Date End Date Taking? Authorizing Provider  albuterol (PROVENTIL HFA;VENTOLIN HFA) 108 (90 Base) MCG/ACT inhaler Inhale 1-2 puffs into the lungs every 6 (six) hours as needed for wheezing or shortness of breath.    [provider]  ALPRAZolam Duanne Moron) 0.5 MG tablet Take 0.5 mg by mouth at bedtime as needed for anxiety.    [provider]  amLODipine (NORVASC) 5 MG tablet Take 5 mg by mouth daily. 08/05/16   [provider]  cetirizine (ZYRTEC) 10 MG tablet Take 10 mg by mouth daily.    [provider]  cloNIDine (CATAPRES) 0.1 MG tablet Take 0.4 mg by mouth.     [provider]  guanFACINE (INTUNIV) 4 MG TB24 ER tablet Take 4 mg by mouth at bedtime. 10/29/16   [provider]  lamoTRIgine (LAMICTAL) 150 MG tablet Take 150 mg by mouth 2 (two) times daily. 06/04/16   [provider]  loratadine (CLARITIN) 10 MG tablet Take 10 mg by mouth daily.    [provider]  Melatonin 10 MG TABS Take 1 tablet by mouth at bedtime as needed.    [provider]  meloxicam (MOBIC) Robert MG tablet Take 1  tablet (Robert mg total) by mouth daily as needed for pain. 01/16/19   Coral Spikes, DO  Multiple Vitamin (MULTIVITAMIN) tablet Take 1 tablet by mouth daily.    [provider]  omeprazole (PRILOSEC) 20 MG capsule Take 20 mg by mouth daily.    [provider]  metFORMIN (GLUCOPHAGE) 1000 MG tablet Take 1,000 mg by mouth daily with breakfast.  01/16/19  [provider]  QUEtiapine (SEROQUEL) 200 MG tablet Take 200 mg by mouth at bedtime.  01/16/19  [provider]    Family History Family History  Problem Relation Age of Onset  . Skin cancer Mother   . GER disease Mother   . Skin cancer  Father     Social History Social History   Tobacco Use  . Smoking status: Never Smoker  . Smokeless tobacco: Never Used  Substance Use Topics  . Alcohol use: No  . Drug use: No     Allergies   Erythromycin and Prednisone   Review of Systems Review of Systems  Constitutional: Negative.   Musculoskeletal:       Right wrist pain.   Physical Exam Triage Vital Signs ED Triage Vitals  Enc Vitals Group     BP 01/16/19 1027 (!) 124/92     Pulse Rate 01/16/19 1027 77     Resp 01/16/19 1027 18     Temp 01/16/19 1027 98 F (36.7 C)     Temp Source 01/16/19 1027 Oral     SpO2 01/16/19 1027 99 %     Weight 01/16/19 1027 185 lb (83.9 kg)     Height 01/16/19 1027 5\' 8"  (1.727 m)     Head Circumference --      Peak Flow --      Pain Score 01/16/19 1026 0     Pain Loc --      Pain Edu? --      Excl. in [place]? --    Updated Vital Signs BP (!) 124/92 (BP Location: Left Arm)   Pulse 77   Temp 98 F (36.7 C) (Oral)   Resp 18   Ht 5\' 8"  (1.727 m)   Wt 83.9 kg   SpO2 99%   BMI 28.13 kg/m   Visual Acuity Right Eye Distance:   Left Eye Distance:   Bilateral Distance:    Right Eye Near:   Left Eye Near:    Bilateral Near:     Physical Exam Vitals signs and nursing note reviewed.  Constitutional:      General: He is not in acute distress.    Appearance: Normal appearance.  HENT:     Head: Normocephalic and atraumatic.  Eyes:     General:        Right eye: No discharge.        Left eye: No discharge.     Conjunctiva/sclera: Conjunctivae normal.  Pulmonary:     Effort: Pulmonary effort is normal. No respiratory distress.  Musculoskeletal:     Comments: Right wrist: Inspection normal with no visible erythema or swelling. ROM smooth and normal with good flexion and extension  Palpation is normal. No snuffbox tenderness.   Skin:    General: Skin is warm.     Findings: No rash.  Neurological:     Mental Status: He is alert.  Psychiatric:        Behavior:  Behavior normal.     Comments: Flat affect.    UC Treatments / Results  Labs (all labs  ordered are listed, but only abnormal results are displayed) Labs Reviewed - No data to display  EKG   Radiology No results found.  Procedures Procedures (including critical care time)  Medications Ordered in UC Medications - No data to display  Initial Impression / Assessment and Plan / UC Course  I have reviewed the triage vital signs and the nursing notes.  Pertinent labs & imaging results that were available during my care of the patient were reviewed by me and considered in my medical decision making (see chart for details).    38 year old Carson presents with right wrist pain.  Meloxicam as directed. Supportive care.  Final Clinical Impressions(s) / UC Diagnoses   Final diagnoses:  Right wrist pain     Discharge Instructions     Medication as needed.  No more bracing.   Take care  Dr. Lacinda Axon    ED Prescriptions    Medication Sig Dispense Auth. Provider   meloxicam (MOBIC) Robert MG tablet Take 1 tablet (Robert mg total) by mouth daily as needed for pain. 30 tablet Coral Spikes, DO     Controlled Substance Prescriptions [place] Controlled Substance Registry consulted? Not Applicable   Coral Spikes, DO 01/16/19 1055

## 2019-01-16 NOTE — Discharge Instructions (Signed)
Medication as needed.  No more bracing.   Take care  Dr. Lacinda Axon

## 2019-01-16 NOTE — ED Triage Notes (Signed)
Pt sprained his wrist a month ago lifting weights. Still having lateral pain with rotation up to a 5/10. No pain at rest.

## 2019-02-20 ENCOUNTER — Ambulatory Visit
Admission: EM | Admit: 2019-02-20 | Discharge: 2019-02-20 | Disposition: A | Discharge status: Home / Self Care | Payer: BC Managed Care – PPO | Attending: Urgent Care | Admitting: Urgent Care

## 2019-02-20 ENCOUNTER — Other Ambulatory Visit: Payer: Self-pay

## 2019-02-20 ENCOUNTER — Ambulatory Visit (INDEPENDENT_AMBULATORY_CARE_PROVIDER_SITE_OTHER): Payer: BC Managed Care – PPO

## 2019-02-20 ENCOUNTER — Encounter: Payer: Self-pay | Admitting: Emergency Medicine

## 2019-02-20 DIAGNOSIS — Y93B3 Activity, free weights: Secondary | ICD-10-CM

## 2019-02-20 DIAGNOSIS — M25531 Pain in right wrist: Secondary | ICD-10-CM

## 2019-02-20 MED ORDER — METHYLPREDNISOLONE 4 MG PO TBPK: ORAL_TABLET | ORAL | 0 refills | Status: DC

## 2019-02-20 NOTE — ED Triage Notes (Signed)
Patient was seen here on 7/18 for a right wrist injury.  Patient states that the pain in his right wrist has not improved since then.  Patient denies any other recent injury.

## 2019-02-20 NOTE — ED Provider Notes (Signed)
Robert Carson, Robert Carson   Name: Robert Carson DOB: 19-Aug-1980 MRN: [id number] CSN: [id number] PCP: Maryland Pink, MD  Arrival date and time:  02/20/19 0858  Chief Complaint:  Wrist Pain (right)   NOTE: Prior to seeing the patient today, I have reviewed the triage nursing documentation and vital signs. Clinical staff has updated patient's PMH/PSHx, current medication list, and drug allergies/intolerances to ensure comprehensive history available to assist in medical decision making.   History:   HPI: Robert Carson is a 38 y.o. male who presents today with complaints of pain in his RIGHT wrist that began initially "13 weeks ago". Patient describes that pain began after doing "wrist curls". Pain with an ulnar distribution. He notes that it worsens with lifting "anything heavier than a plate" and rotation. He was seen here on 01/16/2019 by Dr. Lacinda Carson and diagnosed with a wrist [place]. Patient was wearing a wrist brace that he obtained online. Brace was discontinued and he was prescribed meloxicam; extremity rest was encouraged. Patient presents today with reports of continued pain. He states, "it is not getting any better". Pain 1/10 at rest and increased 5-6/10 with activity.   Past Medical History:  Diagnosis Date  . Anxiety   . Bipolar 1 disorder ([place])   . Chickenpox   . Colon polyp   . Depression   . Diverticulosis   . Erectile dysfunction   . GERD (gastroesophageal reflux disease)   . Hemorrhoids   . Hiatal hernia   . History of kidney stones   . Hypertension   . Kidney stones   . Mechanical low back pain   . Mild sleep apnea   . Tubular adenoma     Past Surgical History:  Procedure Laterality Date  . COLONOSCOPY    . COLONOSCOPY WITH PROPOFOL N/A 06/13/2017   Procedure: COLONOSCOPY WITH PROPOFOL;  Surgeon: [name], MD;  Location: [facility] ENDOSCOPY;  Service: Endoscopy;  Laterality: N/A;  . COLONOSCOPY WITH PROPOFOL N/A 12/08/2017   Procedure: COLONOSCOPY WITH PROPOFOL;   Surgeon: [name], MD;  Location: [facility] ENDOSCOPY;  Service: Endoscopy;  Laterality: N/A;  . cystoscopy    . ESOPHAGOGASTRODUODENOSCOPY (EGD) WITH PROPOFOL N/A 06/13/2017   Procedure: ESOPHAGOGASTRODUODENOSCOPY (EGD) WITH PROPOFOL;  Surgeon: [name], MD;  Location: [facility] ENDOSCOPY;  Service: Endoscopy;  Laterality: N/A;  . TONSILLECTOMY      Family History  Problem Relation Age of Onset  . Skin cancer Mother   . GER disease Mother   . Skin cancer Father     Social History   Tobacco Use  . Smoking status: Never Smoker  . Smokeless tobacco: Never Used  Substance Use Topics  . Alcohol use: No  . Drug use: No    Patient Active Problem List   Diagnosis Date Noted  . Anxiety 10/31/2016  . Depression 10/31/2016  . GERD (gastroesophageal reflux disease) 10/31/2016  . Hiatal hernia 10/31/2016  . Hypertension 10/31/2016  . Kidney stones 10/31/2016    Home Medications:    Current Meds  Medication Sig  . amLODipine (NORVASC) 5 MG tablet Take 5 mg by mouth daily.  . cloNIDine (CATAPRES) 0.1 MG tablet Take 0.4 mg by mouth.   . lamoTRIgine (LAMICTAL) 150 MG tablet Take 150 mg by mouth 2 (two) times daily.  Marland Kitchen loratadine (CLARITIN) 10 MG tablet Take 10 mg by mouth daily.  . Melatonin 10 MG TABS Take 1 tablet by mouth at bedtime as needed.  . meloxicam (MOBIC) 15 MG tablet Take 1  tablet (15 mg total) by mouth daily as needed for pain.  . Multiple Vitamin (MULTIVITAMIN) tablet Take 1 tablet by mouth daily.  Marland Kitchen omeprazole (PRILOSEC) 20 MG capsule Take 20 mg by mouth daily.    Allergies:   Erythromycin and Prednisone  Review of Systems (ROS): Review of Systems  Constitutional: Negative for chills and fever.  Respiratory: Negative for cough and shortness of breath.   Cardiovascular: Negative for chest pain and palpitations.  Musculoskeletal:       RIGHT wrist pain  Skin: Negative for color change.  Neurological: Negative for weakness and numbness.  All other  systems reviewed and are negative.    Vital Signs: Today's Vitals   02/20/19 0905 02/20/19 0909 02/20/19 0956  BP:  130/89   Pulse:  71   Resp:  16   Temp:  98.1 F (36.7 C)   TempSrc:  Oral   SpO2:  100%   Weight: 185 lb (83.9 kg)    Height: 5\' 9"  (1.753 m)    PainSc: 1   1     Physical Exam: Physical Exam  Constitutional: He is oriented to person, place, and time and well-developed, well-nourished, and in no distress.  HENT:  Head: Normocephalic and atraumatic.  Mouth/Throat: Mucous membranes are normal.  Eyes: Pupils are equal, round, and reactive to light. EOM are normal.  Cardiovascular: Normal rate.  Pulmonary/Chest: Effort normal. No respiratory distress.  Musculoskeletal:     Right wrist: He exhibits tenderness. He exhibits no swelling, no crepitus and no deformity.  Neurological: He is alert and oriented to person, place, and time. Gait normal.  Skin: Skin is warm and dry. No rash noted.  Psychiatric: Mood, memory, affect and judgment normal.  Nursing note and vitals reviewed.   Urgent Care Treatments / Results:   LABS: PLEASE NOTE: all labs that were ordered this encounter are listed, however only abnormal results are displayed. Labs Reviewed - No data to display  EKG: -None  RADIOLOGY: Dg Wrist Complete Right  Result Date: 02/20/2019 CLINICAL DATA:  Nontraumatic right wrist pain EXAM: RIGHT WRIST - COMPLETE 3+ VIEW COMPARISON:  None. FINDINGS: No fracture or dislocation is seen. The joint spaces are preserved. The visualized soft tissues are unremarkable. IMPRESSION: Negative. Electronically Signed   By: Julian Hy M.D.   On: 02/20/2019 09:47    PROCEDURES: Procedures  MEDICATIONS RECEIVED THIS VISIT: Medications - No data to display  PERTINENT CLINICAL COURSE NOTES/UPDATES:   Initial Impression / Assessment and Plan / Urgent Care Course:  Pertinent labs & imaging results that were available during my care of the patient were personally  reviewed by me and considered in my medical decision making (see lab/imaging section of note for values and interpretations).  Robert Carson is a 38 y.o. male who presents to [facility] today with complaints of Wrist Pain (right)   Patient is well appearing overall in clinic today. He does not appear to be in any acute distress. Presenting symptoms (see HPI) and exam as documented above. Diagnostic plain films of the RIGHT wrist are negative for acute fracture or dislocation; joint spaces preserved. He has used immobilization and NSAIDs without relief of symptoms. Will add methylprednisolone. Patient to continue rest, ice, and continue PRN use of NSAIDs. Due to chronicity of symptoms, coupled with negative plain films, patient will need to be seen by orthopedics for further evaluation and ongoing management.  Name and office contact information provided on today's AVS for Dr. Sabra Heck or Peggye Ley.  Patient advised the he will need to contact the office to schedule an appointment to be seen.   I have reviewed the follow up and strict return precautions for any new or worsening symptoms. Patient is aware of symptoms that would be deemed urgent/emergent, and would thus require further evaluation either here or in the emergency department. At the time of discharge, he verbalized understanding and consent with the discharge plan as it was reviewed with him. All questions were fielded by provider and/or clinic staff prior to patient discharge.    Final Clinical Impressions / Urgent Care Diagnoses:   Final diagnoses:  Right wrist pain    New Prescriptions:  [place] Controlled Substance Registry consulted? Not Applicable  Meds ordered this encounter  Medications  . methylPREDNISolone (MEDROL DOSEPAK) 4 MG TBPK tablet    Sig: Take by mouth daily - taper daily dose per package instructions.    Dispense:  21 tablet    Refill:  0    Recommended Follow up Care:  Patient encouraged to follow up with the  following provider within the specified time frame, or sooner as dictated by the severity of his symptoms. As always, he was instructed that for any urgent/emergent care needs, he should seek care either here or in the emergency department for more immediate evaluation.  Follow-up Information    Earnestine Leys, MD. Call in 1 week.   Specialty: Orthopedic Surgery Why: Call and advise that them that you need to see Dr. Sabra Heck or Dr. Peggye Ley for a prolonged issue with your hand/wrist. Contact information: [place] [address] [phone]         NOTE: This note was prepared using Dragon dictation software along with smaller phrase technology. Despite my best ability to proofread, there is the potential that transcriptional errors may still occur from this process, and are completely unintentional.   Karen Kitchens, NP 02/20/19 1010

## 2019-02-20 NOTE — Discharge Instructions (Addendum)
It was very nice seeing you today in clinic. Thank you for entrusting me with your care.   REST and ice wrist. I would recommend a soft brace (over the counter). May use Tylenol and/or ibuprofen as needed for pain. Will send in a steroid taper to help with the pain.  Make arrangements to follow up with orthopedic  doctor in 1 week for re-evaluation if not improving. I have provided you the name and office contact information for an excellent local provider. If your symptoms/condition worsens, please seek follow up care either here or in the ER. Please remember, our [facility] providers are "right here with you" when you need [place].   Again, it was my pleasure to take care of you today. Thank you for choosing our clinic. I hope that you start to feel better quickly.   Honor Loh, MSN, APRN, FNP-C, CEN Advanced Practice Provider [facility]

## 2019-05-19 ENCOUNTER — Ambulatory Visit (INDEPENDENT_AMBULATORY_CARE_PROVIDER_SITE_OTHER): Payer: BC Managed Care – PPO | Admitting: Urology

## 2019-05-19 ENCOUNTER — Other Ambulatory Visit: Payer: Self-pay

## 2019-05-19 ENCOUNTER — Encounter: Payer: Self-pay | Admitting: Urology

## 2019-05-19 VITALS — BP 134/88 | HR 86 | Ht 68.0 in | Wt 195.7 lb

## 2019-05-19 DIAGNOSIS — N400 Enlarged prostate without lower urinary tract symptoms: Secondary | ICD-10-CM | POA: Insufficient documentation

## 2019-05-19 DIAGNOSIS — N3944 Nocturnal enuresis: Secondary | ICD-10-CM | POA: Diagnosis not present

## 2019-05-19 DIAGNOSIS — R351 Nocturia: Secondary | ICD-10-CM | POA: Diagnosis not present

## 2019-05-19 MED ORDER — DESMOPRESSIN ACETATE 0.2 MG PO TABS: ORAL_TABLET | ORAL | 0 refills | Status: AC

## 2019-05-19 NOTE — Progress Notes (Signed)
05/19/2019 3:58 PM   Robert Carson [birth year] [id number]  Referring provider: Maryland Pink, MD [address] [facility] [place],  [place] [address]  Chief Complaint  Patient presents with  . Nocturia    HPI: 38 y.o. male seen in consultation at the request of Dr. Kary Kos for evaluation of nocturia and nocturnal enuresis.  He states he had 2 episodes of nocturnal enuresis the first in March 2020 and the second in October 2020.  This has not occurred since he voids once he feels the urge during his sleeping hours.  For the past 3 months he has noted nocturia x3-4.  His nighttime voided volumes are equivalent to his daytime volumes.  He limits his fluid intake after dinnertime.  He has no bothersome daytime symptoms or urinary frequency/incontinence.  He voids with a good stream.  During the nighttime hours he does have some urinary hesitancy and post void dribbling at the end of urination.  Denies dysuria, gross hematuria or UTI.  His nocturia is not bothersome and he is not interested in a daytime medication.  He is primarily interested in taking a medication when he is traveling or away from home as a preventative med.   PMH: Past Medical History:  Diagnosis Date  . Anxiety   . Bipolar 1 disorder ([place])   . BPH (benign prostatic hyperplasia)   . Chickenpox   . Colon polyp   . Depression   . Diverticulosis   . Erectile dysfunction   . GERD (gastroesophageal reflux disease)   . Hemorrhoids   . Hiatal hernia   . History of kidney stones   . Hypertension   . Kidney stones   . Mechanical low back pain   . Mild sleep apnea   . Tubular adenoma     Surgical History: Past Surgical History:  Procedure Laterality Date  . COLONOSCOPY    . COLONOSCOPY WITH PROPOFOL N/A 06/13/2017   Procedure: COLONOSCOPY WITH PROPOFOL;  Surgeon: [name], MD;  Location: [facility] ENDOSCOPY;  Service: Endoscopy;  Laterality: N/A;  . COLONOSCOPY WITH PROPOFOL N/A 12/08/2017   Procedure: COLONOSCOPY WITH PROPOFOL;  Surgeon: [name], MD;  Location: [facility] ENDOSCOPY;  Service: Endoscopy;  Laterality: N/A;  . cystoscopy    . ESOPHAGOGASTRODUODENOSCOPY (EGD) WITH PROPOFOL N/A 06/13/2017   Procedure: ESOPHAGOGASTRODUODENOSCOPY (EGD) WITH PROPOFOL;  Surgeon: [name], MD;  Location: [facility] ENDOSCOPY;  Service: Endoscopy;  Laterality: N/A;  . TONSILLECTOMY      Home Medications:  Allergies as of 05/19/2019      Reactions   Erythromycin    Prednisone       Medication List       Accurate as of May 19, 2019  3:58 PM. If you have any questions, ask your nurse or doctor.        STOP taking these medications   meloxicam 15 MG tablet Commonly known as: MOBIC Stopped by: Abbie Sons, MD   methylPREDNISolone 4 MG Tbpk tablet Commonly known as: MEDROL DOSEPAK Stopped by: Abbie Sons, MD     TAKE these medications   amLODipine 5 MG tablet Commonly known as: NORVASC Take 5 mg by mouth daily.   cloNIDine 0.1 MG tablet Commonly known as: CATAPRES Take 0.4 mg by mouth.   cloNIDine HCl 0.1 MG Tb12 ER tablet Commonly known as: KAPVAY Take 0.4 mg by mouth every morning.   lamoTRIgine 150 MG tablet Commonly known as: LAMICTAL Take 150 mg by mouth 2 (two)  times daily.   loratadine 10 MG tablet Commonly known as: CLARITIN Take 10 mg by mouth daily.   Melatonin 10 MG Tabs Take 1 tablet by mouth at bedtime as needed.   multivitamin tablet Take 1 tablet by mouth daily.   omeprazole 20 MG capsule Commonly known as: PRILOSEC Take 20 mg by mouth daily.   Probiotic 1-250 BILLION-MG Caps   RA Fish Oil 1000 MG Caps Take by mouth.   valACYclovir 1000 MG tablet Commonly known as: VALTREX 2 po x 1, repeat in 12 hrs   Vitamin D-3 25 MCG (1000 UT) Caps       Allergies:  Allergies  Allergen Reactions  . Erythromycin   . Prednisone     Family History: Family History  Problem Relation Age of Onset  . Skin cancer Mother    . GER disease Mother   . Skin cancer Father     Social History:  reports that he has never smoked. He has never used smokeless tobacco. He reports that he does not drink alcohol or use drugs.  ROS: UROLOGY Frequent Urination?: No Hard to postpone urination?: No Burning/pain with urination?: No Get up at night to urinate?: Yes Leakage of urine?: No Urine stream starts and stops?: No Trouble starting stream?: No Do you have to strain to urinate?: No Blood in urine?: No Urinary tract infection?: No Sexually transmitted disease?: No Injury to kidneys or bladder?: No Painful intercourse?: No Weak stream?: No Erection problems?: No Penile pain?: No  Gastrointestinal Nausea?: No Vomiting?: No Indigestion/heartburn?: No Diarrhea?: No Constipation?: No  Constitutional Fever: No Night sweats?: No Weight loss?: No Fatigue?: No  Skin Skin rash/lesions?: No Itching?: No  Eyes Blurred vision?: No Double vision?: No  Ears/Nose/Throat Sore throat?: No Sinus problems?: No  Hematologic/Lymphatic Swollen glands?: No Easy bruising?: No  Cardiovascular Leg swelling?: No Chest pain?: No  Respiratory Cough?: No Shortness of breath?: No  Endocrine Excessive thirst?: No  Musculoskeletal Back pain?: No Joint pain?: No  Neurological Headaches?: No Dizziness?: No  Psychologic Depression?: No Anxiety?: No  Physical Exam: BP 134/88 (BP Location: Left Arm, Patient Position: Sitting, Cuff Size: Normal)   Pulse 86   Ht 5\' 8"  (1.727 m)   Wt 195 lb 11.2 oz (88.8 kg)   BMI 29.76 kg/m   Constitutional:  Alert and oriented, No acute distress. HEENT: [place] AT, moist mucus membranes.  Trachea midline, no masses. Cardiovascular: No clubbing, cyanosis, or edema. Respiratory: Normal respiratory effort, no increased work of breathing. GI: Abdomen is soft, nontender, nondistended, no abdominal masses GU: Phallus circumcised without lesions, testes descended bilaterally  without masses or tenderness spermatic cord/epididymis palpably normal bilaterally.  Prostate 40 g, smooth without nodules. Lymph: No cervical or inguinal lymphadenopathy. Skin: No rashes, bruises or suspicious lesions. Neurologic: Grossly intact, no focal deficits, moving all 4 extremities. Psychiatric: Normal mood and affect.   Assessment & Plan:    - Nocturia Most likely represent nocturnal polyuria although his nocturia is not bothersome and does not interrupt with his sleep pattern.  He does not desire any treatment.  - Nocturnal enuresis No recurrent episodes since he has been voiding at onset of nighttime urge.  He desired a medication when he will be away from home which he estimates he would take infrequently.  Will give a trial of DDAVP 0.2 mg at bedtime.  Follow-up annually or as needed for any worsening symptoms.   Abbie Sons, MD  [facility] [address] [place], [place] [address] [phone]  

## 2019-11-08 ENCOUNTER — Encounter: Payer: Self-pay | Admitting: Urology

## 2020-05-17 ENCOUNTER — Ambulatory Visit: Payer: BC Managed Care – PPO | Admitting: Urology

## 2020-12-28 IMAGING — CR RIGHT WRIST - COMPLETE 3+ VIEW
4 series · 4 of 4 positions shown · non-contrast
Comparison: None.

CLINICAL DATA: Nontraumatic right wrist pain

EXAM:
RIGHT WRIST - COMPLETE 3+ VIEW

[wrist pa]
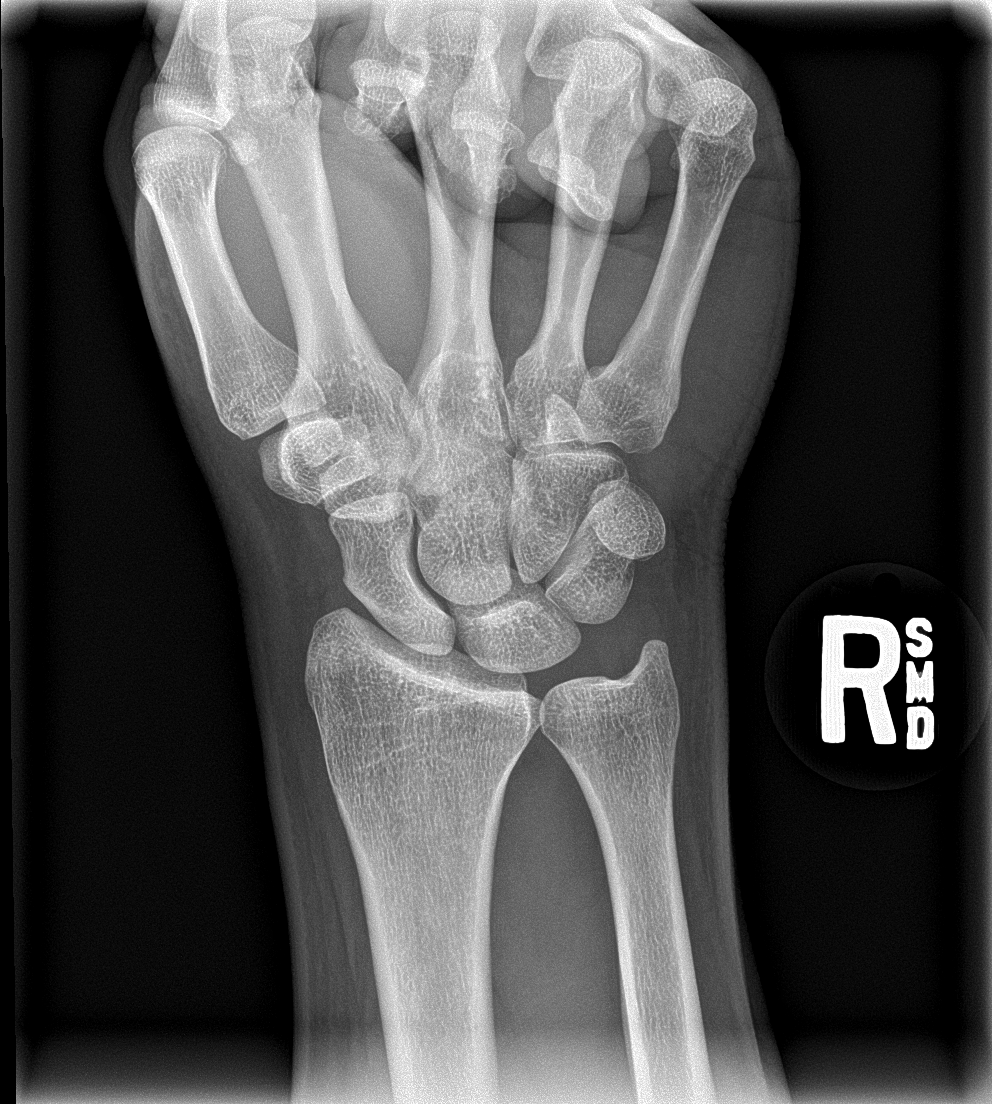

[wrist obl]
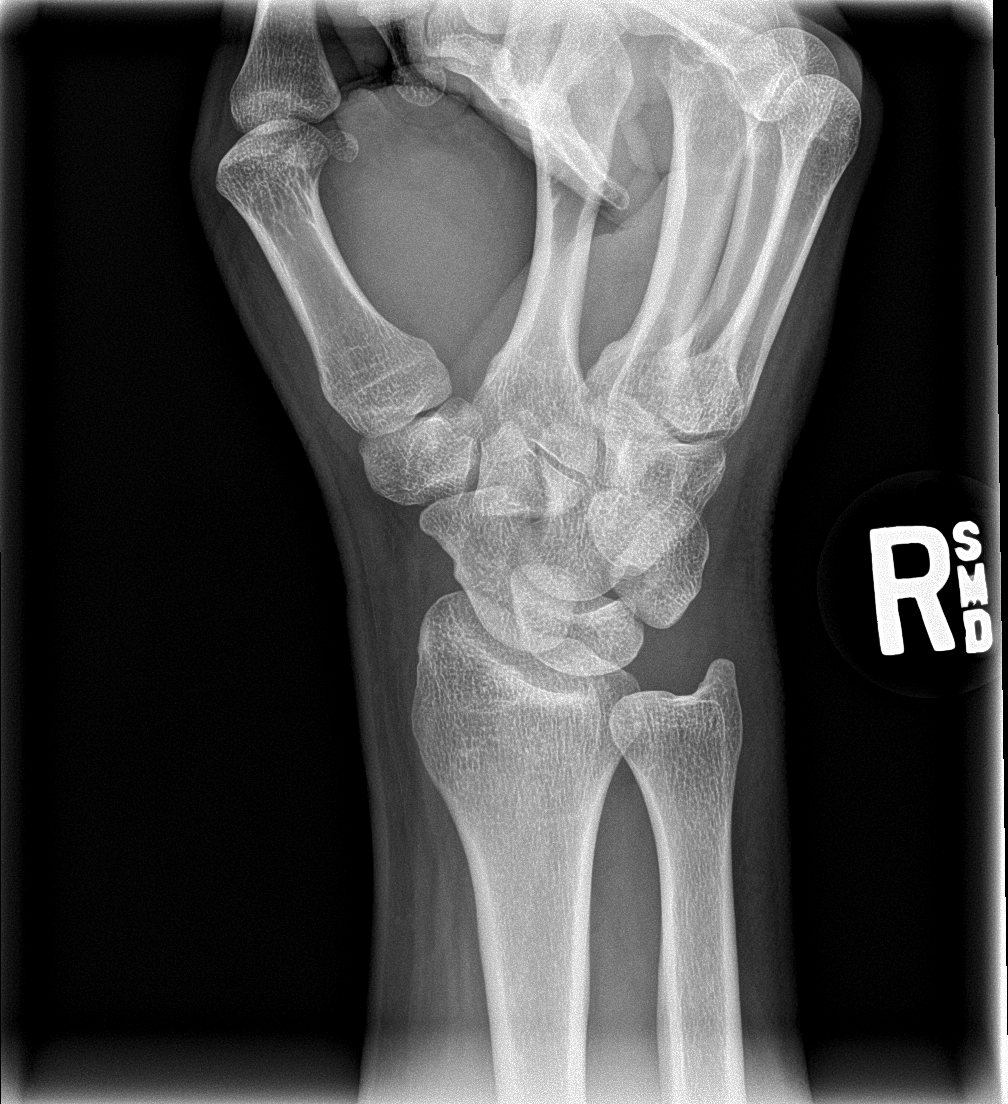

[wrist lat]
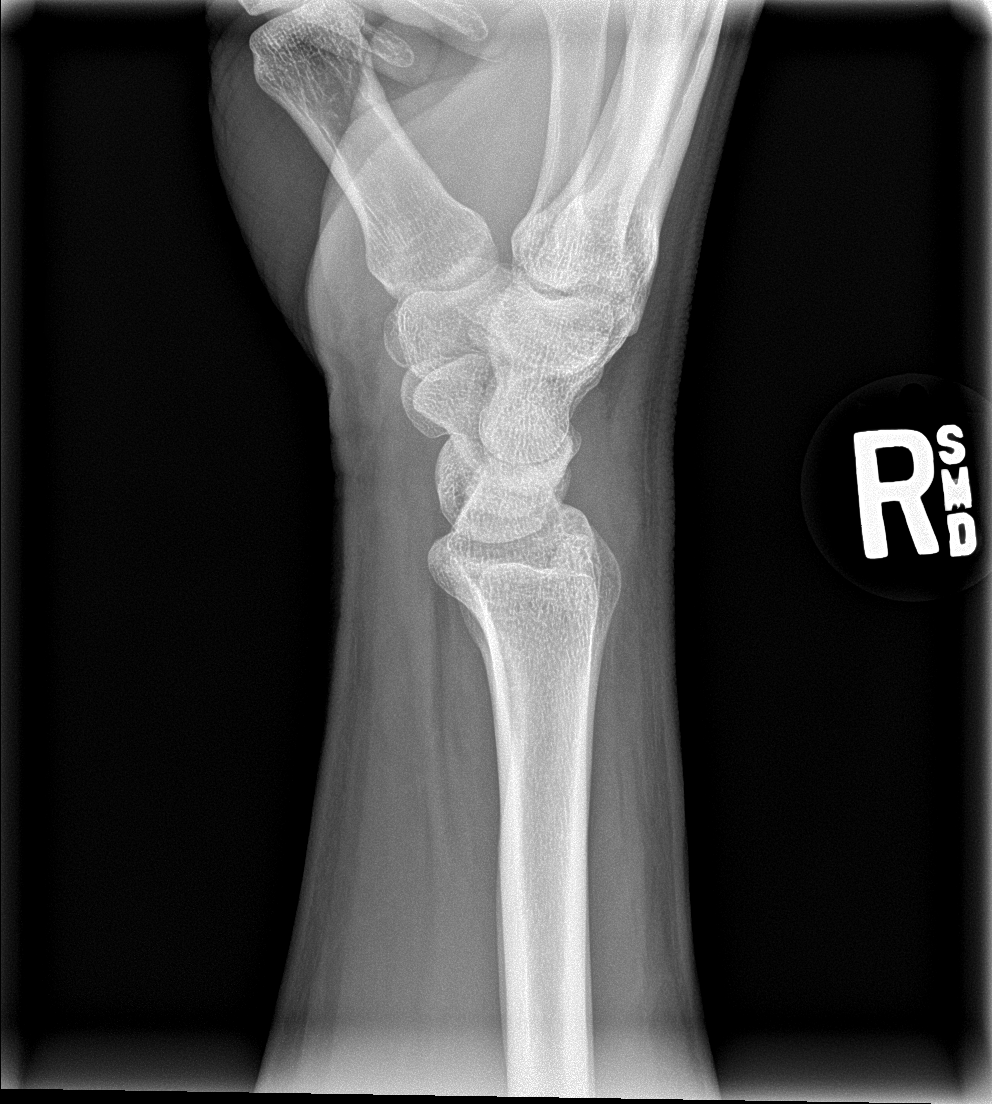

[wrist navicular]
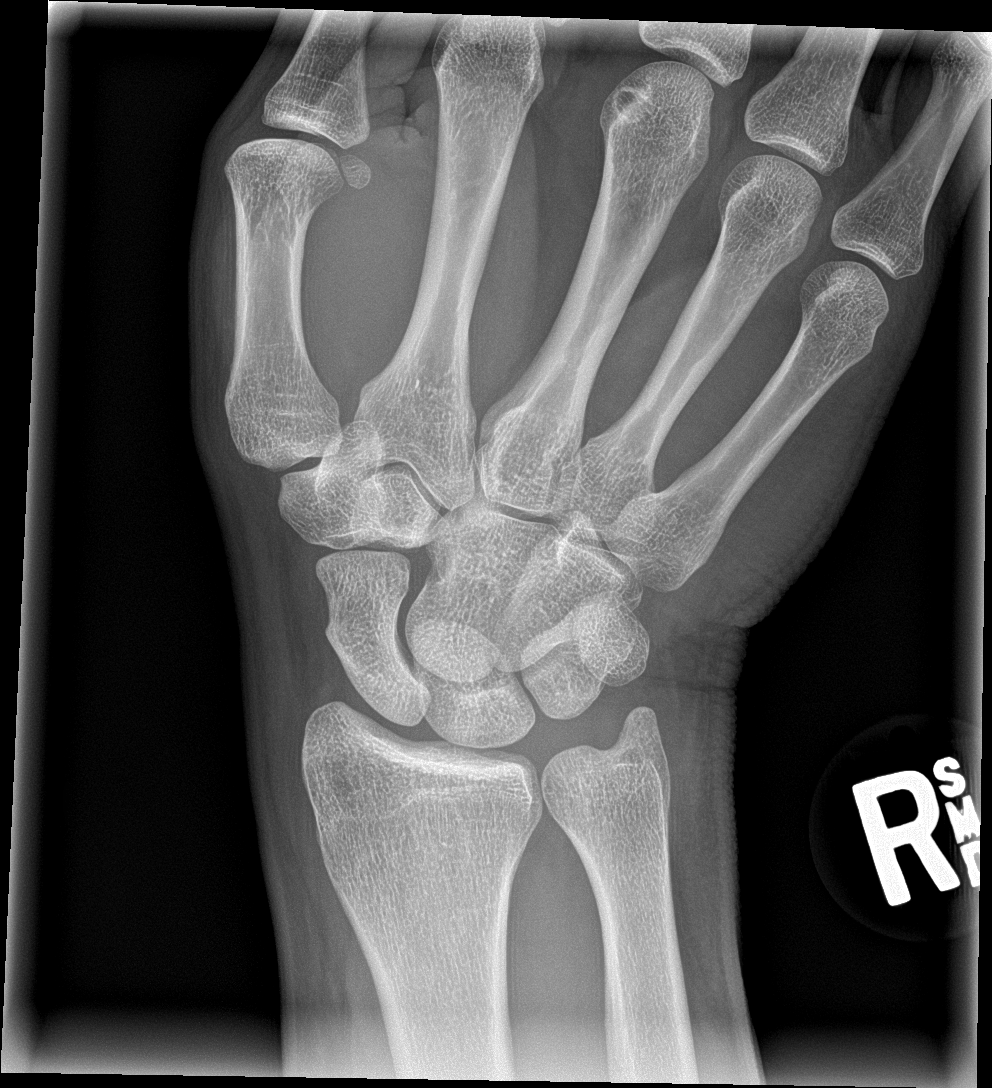

[4 of 4 positions shown; findings below may reference images not displayed]

FINDINGS: No fracture or dislocation is seen.

The joint spaces are preserved.

The visualized soft tissues are unremarkable.
IMPRESSION: Negative.

## 2023-03-19 ENCOUNTER — Ambulatory Visit
Admission: RE | Admit: 2023-03-19 | Discharge: 2023-03-19 | Disposition: A | Discharge status: Home / Self Care | Payer: 59 | Source: Ambulatory Visit | Attending: Physician Assistant | Admitting: Physician Assistant

## 2023-03-19 VITALS — BP 148/103 | HR 105 | Temp 98.4°F | Resp 16 | Ht 67.0 in | Wt 177.0 lb

## 2023-03-19 DIAGNOSIS — Z113 Encounter for screening for infections with a predominantly sexual mode of transmission: Secondary | ICD-10-CM | POA: Insufficient documentation

## 2023-03-19 NOTE — ED Triage Notes (Signed)
Pt presents to UC stating main thing I would like to get is a comprehensive STD test. If I can address something else in the same visit, I have recent onset of erectile dysfunction within the past 2 weeks. Not sure if its physical or psychological. - Entered by patient

## 2023-03-19 NOTE — Discharge Instructions (Signed)
-  Results of the STI screening will come through MyChart.  Will contact you with any positive results.  If you are still concerned about potential HIV or syphilis you may want to repeat testing in approximately 6 weeks.

## 2023-03-19 NOTE — ED Provider Notes (Signed)
[facility]    CSN: [id number] Arrival date & time: 03/19/23  1835      History   Chief Complaint Chief Complaint  Patient presents with   SEXUALLY TRANSMITTED DISEASE    Appt    HPI Robert Carson is a 42 y.o. male presenting for STI screening.  Patient states he is sexually active with his wife and another male partner who he has a friends with benefits relationship with.  He was last sexually active with this friends with benefits partner 2 weeks ago.  Patient says they used a condom for penile vaginal sex but did not use a dental dam with oral sex.  He denies any symptoms.  He says he just wants to be cautious.  He has never had any STD testing in the past.  He is denying any dysuria, urgency, hematuria, testicular pain, genital lesions or rashes, urethral discharge.  Denies any symptoms in his wife or other male partner.  HPI  Past Medical History:  Diagnosis Date   Anxiety    Bipolar 1 disorder (HCC)    BPH (benign prostatic hyperplasia)    Chickenpox    Colon polyp    Depression    Diverticulosis    Erectile dysfunction    GERD (gastroesophageal reflux disease)    Hemorrhoids    Hiatal hernia    History of kidney stones    Hypertension    Kidney stones    Mechanical low back pain    Mild sleep apnea    Tubular adenoma     Patient Active Problem List   Diagnosis Date Noted   Enlarged prostate 05/19/2019   Nocturia 05/19/2019   Nocturnal enuresis 05/19/2019   Anxiety 10/31/2016   Depression 10/31/2016   GERD (gastroesophageal reflux disease) 10/31/2016   Hiatal hernia 10/31/2016   Hypertension 10/31/2016   Kidney stones 10/31/2016    Past Surgical History:  Procedure Laterality Date   COLONOSCOPY     COLONOSCOPY WITH PROPOFOL N/A 06/13/2017   Procedure: COLONOSCOPY WITH PROPOFOL;  Surgeon: Christena Deem, MD;  Location: [facility] ENDOSCOPY;  Service: Endoscopy;  Laterality: N/A;   COLONOSCOPY WITH PROPOFOL N/A 12/08/2017   Procedure:  COLONOSCOPY WITH PROPOFOL;  Surgeon: Christena Deem, MD;  Location: [facility] ENDOSCOPY;  Service: Endoscopy;  Laterality: N/A;   cystoscopy     ESOPHAGOGASTRODUODENOSCOPY (EGD) WITH PROPOFOL N/A 06/13/2017   Procedure: ESOPHAGOGASTRODUODENOSCOPY (EGD) WITH PROPOFOL;  Surgeon: Christena Deem, MD;  Location: [facility] ENDOSCOPY;  Service: Endoscopy;  Laterality: N/A;   TONSILLECTOMY         Home Medications    Prior to Admission medications   Medication Sig Start Date End Date Taking? Authorizing Provider  ALPRAZolam (XANAX) 0.5 MG tablet Take 0.25 mg by mouth 2 (two) times daily as needed for Sleep Down to 0.25mg    Yes [provider]  amphetamine-dextroamphetamine (ADDERALL) 10 MG tablet SMARTSIG:0.5-1 Tablet(s) By Mouth Every Evening 02/18/23  Yes [provider]  ARIPiprazole (ABILIFY) 2 MG tablet Take 1 tablet by mouth daily.   Yes [provider]  Bacillus Coagulans-Inulin (PROBIOTIC) 1-250 BILLION-MG CAPS  07/01/16  Yes [provider]  busPIRone (BUSPAR) 15 MG tablet SMARTSIG:1 Tablet(s) By Mouth 1-3 Times Daily PRN 03/08/23  Yes [provider]  cloNIDine (CATAPRES) 0.1 MG tablet Take 0.4 mg by mouth.    Yes [provider]  cyclobenzaprine (FLEXERIL) 5 MG tablet Take by mouth. 01/23/23  Yes [provider]  desmopressin (DDAVP) 0.2 MG tablet 1  tab as needed at bedtime 05/19/19  Yes Stoioff, Verna Czech, MD  escitalopram (LEXAPRO) 5 MG tablet Take 5 mg by mouth daily. 03/19/23  Yes [provider]  famciclovir (FAMVIR) 500 MG tablet Take 500 mg by mouth daily as needed. 02/28/23  Yes [provider]  hydrOXYzine (ATARAX) 25 MG tablet 1/2-1 TABLET AS NEEDED FOR ACUTE ANXIETY. MAY INCREASE UP TO 2 TABLETS AS NEEDED FOR INSOMNIA   Yes [provider]  lamoTRIgine (LAMICTAL) 150 MG tablet Take 150 mg by mouth 2 (two) times daily. 06/04/16  Yes [provider]  loratadine (CLARITIN) 10 MG tablet Take 10  mg by mouth daily.   Yes [provider]  Melatonin 10 MG TABS Take 1 tablet by mouth at bedtime as needed.   Yes [provider]  meloxicam (MOBIC) 15 MG tablet Take 1 tablet by mouth daily. 03/01/22  Yes [provider]  Multiple Vitamin (MULTIVITAMIN) tablet Take 1 tablet by mouth daily.   Yes [provider]  pramipexole (MIRAPEX) 0.25 MG tablet PLEASE SEE ATTACHED FOR DETAILED DIRECTIONS   Yes [provider]  tadalafil (CIALIS) 5 MG tablet Take 5 mg by mouth daily. 03/19/23  Yes [provider]  Theanine 200 MG CAPS    Yes [provider]  amLODipine (NORVASC) 5 MG tablet Take 5 mg by mouth daily. 08/05/16   [provider]  Cholecalciferol (VITAMIN D-3) 25 MCG (1000 UT) CAPS  07/01/17   [provider]  cloNIDine HCl (KAPVAY) 0.1 MG TB12 ER tablet Take 0.4 mg by mouth every morning. 05/01/19   [provider]  Omega-3 Fatty Acids (RA FISH OIL) 1000 MG CAPS Take by mouth. 07/01/16   [provider]  omeprazole (PRILOSEC) 20 MG capsule Take 20 mg by mouth daily.    [provider]  valACYclovir (VALTREX) 1000 MG tablet 2 po x 1, repeat in 12 hrs 04/21/18   [provider]  cetirizine (ZYRTEC) 10 MG tablet Take 10 mg by mouth daily.  02/20/19  [provider]  guanFACINE (INTUNIV) 4 MG TB24 ER tablet Take 4 mg by mouth at bedtime. 10/29/16 02/20/19  [provider]  metFORMIN (GLUCOPHAGE) 1000 MG tablet Take 1,000 mg by mouth daily with breakfast.  01/16/19  [provider]  QUEtiapine (SEROQUEL) 200 MG tablet Take 200 mg by mouth at bedtime.  01/16/19  [provider]    Family History Family History  Problem Relation Age of Onset   Skin cancer Mother    GER disease Mother    Skin cancer Father     Social History Social History   Tobacco Use   Smoking status: Never   Smokeless tobacco: Never  Vaping Use   Vaping status: Never Used  Substance  Use Topics   Alcohol use: No   Drug use: No     Allergies   Erythromycin and Prednisone   Review of Systems Review of Systems  Constitutional:  Negative for fatigue and fever.  Eyes:  Negative for discharge.  Gastrointestinal:  Negative for abdominal pain, nausea and vomiting.  Genitourinary:  Negative for dysuria, frequency, genital sores, hematuria, penile discharge, penile pain, penile swelling, scrotal swelling, testicular pain and urgency.  Musculoskeletal:  Negative for arthralgias.  Skin:  Negative for rash.  Neurological:  Negative for weakness.     Physical Exam Triage Vital Signs ED Triage Vitals  Encounter Vitals Group     BP --      Systolic BP  Percentile --      Diastolic BP Percentile --      Pulse --      Resp 03/19/23 1902 16     Temp --      Temp Source 03/19/23 1902 Oral     SpO2 --      Weight 03/19/23 1900 177 lb (80.3 kg)     Height 03/19/23 1900 5\' 7"  (1.702 m)     Head Circumference --      Peak Flow --      Pain Score 03/19/23 1904 0     Pain Loc --      Pain Education --      Exclude from Growth Chart --    No data found.  Updated Vital Signs BP (!) 148/103 (BP Location: Left Arm)   Pulse (!) 105   Temp 98.4 F (36.9 C) (Oral)   Resp 16   Ht 5\' 7"  (1.702 m)   Wt 177 lb (80.3 kg)   SpO2 97%   BMI 27.72 kg/m      Physical Exam Vitals and nursing note reviewed.  Constitutional:      General: He is not in acute distress.    Appearance: Normal appearance. He is well-developed. He is not ill-appearing.  HENT:     Head: Normocephalic and atraumatic.  Eyes:     General: No scleral icterus.    Conjunctiva/sclera: Conjunctivae normal.  Cardiovascular:     Rate and Rhythm: Normal rate and regular rhythm.  Pulmonary:     Effort: Pulmonary effort is normal. No respiratory distress.  Musculoskeletal:     Cervical back: Neck supple.  Skin:    General: Skin is warm and dry.     Capillary Refill: Capillary refill takes less than 2  seconds.  Neurological:     General: No focal deficit present.     Mental Status: He is alert. Mental status is at baseline.     Motor: No weakness.     Gait: Gait normal.  Psychiatric:        Mood and Affect: Mood normal.        Behavior: Behavior normal.      UC Treatments / Results  Labs (all labs ordered are listed, but only abnormal results are displayed) Labs Reviewed  RPR  HIV ANTIBODY (ROUTINE TESTING W REFLEX)  CYTOLOGY, (ORAL, ANAL, URETHRAL) ANCILLARY ONLY    EKG   Radiology No results found.  Procedures Procedures (including critical care time)  Medications Ordered in UC Medications - No data to display  Initial Impression / Assessment and Plan / UC Course  I have reviewed the triage vital signs and the nursing notes.  Pertinent labs & imaging results that were available during my care of the patient were reviewed by me and considered in my medical decision making (see chart for details).   42 year old male presents for STI screening.  He has never had STI testing before.  He is sexually active with his wife and 1 other male.  Denies any symptoms whatsoever.  Patient elected to perform a self swab for GC/chlamydia/trichomonas and also requested HIV and RPR testing.  Reviewed how to access results.  Explained that we will contact him with any positive results and proceed from there.  Explained he should have safe sex.  If he still concerned about potential HIV or syphilis due to recent sexual contact he should consider repeat STI [place] in about 6 weeks.   Final Clinical Impressions(s) / UC  Diagnoses   Final diagnoses:  Screening examination for sexually transmitted disease     Discharge Instructions      -Results of the STI screening will come through MyChart.  Will contact you with any positive results.  If you are still concerned about potential HIV or syphilis you may want to repeat testing in approximately 6 weeks.     ED Prescriptions    None    PDMP not reviewed this encounter.   Shirlee Latch, PA-C 03/19/23 1944

## 2023-03-20 LAB — HIV ANTIBODY (ROUTINE TESTING W REFLEX): HIV Screen 4th Generation wRfx: NONREACTIVE

## 2023-03-21 LAB — CYTOLOGY, (ORAL, ANAL, URETHRAL) ANCILLARY ONLY
Chlamydia: NEGATIVE
Comment: NEGATIVE
Comment: NEGATIVE
Comment: NORMAL
Neisseria Gonorrhea: NEGATIVE
Trichomonas: NEGATIVE

## 2023-03-21 LAB — RPR: RPR Ser Ql: NONREACTIVE

## 2023-06-06 ENCOUNTER — Ambulatory Visit
Admission: RE | Admit: 2023-06-06 | Discharge: 2023-06-06 | Disposition: A | Discharge status: Home / Self Care | Payer: 59 | Source: Ambulatory Visit | Attending: Emergency Medicine | Admitting: Emergency Medicine

## 2023-06-06 VITALS — BP 151/98 | HR 83 | Temp 98.4°F | Resp 16 | Ht 67.0 in | Wt 177.0 lb

## 2023-06-06 DIAGNOSIS — Z113 Encounter for screening for infections with a predominantly sexual mode of transmission: Secondary | ICD-10-CM

## 2023-06-06 LAB — HIV ANTIBODY (ROUTINE TESTING W REFLEX): HIV Screen 4th Generation wRfx: NONREACTIVE

## 2023-06-06 NOTE — ED Provider Notes (Signed)
[facility]    CSN: [id number] Arrival date & time: 06/06/23  1810      History   Chief Complaint Chief Complaint  Patient presents with   Exposure to STD    Appointment    HPI Robert Carson is a 42 y.o. male.   Patient presents for evaluation for routine STI testing.  Sexually active, no known exposures.  Denying all symptoms.  Past Medical History:  Diagnosis Date   Anxiety    Bipolar 1 disorder (HCC)    BPH (benign prostatic hyperplasia)    Chickenpox    Colon polyp    Depression    Diverticulosis    Erectile dysfunction    GERD (gastroesophageal reflux disease)    Hemorrhoids    Hiatal hernia    History of kidney stones    Hypertension    Kidney stones    Mechanical low back pain    Mild sleep apnea    Tubular adenoma     Patient Active Problem List   Diagnosis Date Noted   Enlarged prostate 05/19/2019   Nocturia 05/19/2019   Nocturnal enuresis 05/19/2019   Anxiety 10/31/2016   Depression 10/31/2016   GERD (gastroesophageal reflux disease) 10/31/2016   Hiatal hernia 10/31/2016   Hypertension 10/31/2016   Kidney stones 10/31/2016    Past Surgical History:  Procedure Laterality Date   COLONOSCOPY     COLONOSCOPY WITH PROPOFOL N/A 06/13/2017   Procedure: COLONOSCOPY WITH PROPOFOL;  Surgeon: Christena Deem, MD;  Location: [facility] ENDOSCOPY;  Service: Endoscopy;  Laterality: N/A;   COLONOSCOPY WITH PROPOFOL N/A 12/08/2017   Procedure: COLONOSCOPY WITH PROPOFOL;  Surgeon: Christena Deem, MD;  Location: [facility] ENDOSCOPY;  Service: Endoscopy;  Laterality: N/A;   cystoscopy     ESOPHAGOGASTRODUODENOSCOPY (EGD) WITH PROPOFOL N/A 06/13/2017   Procedure: ESOPHAGOGASTRODUODENOSCOPY (EGD) WITH PROPOFOL;  Surgeon: Christena Deem, MD;  Location: [facility] ENDOSCOPY;  Service: Endoscopy;  Laterality: N/A;   TONSILLECTOMY         Home Medications    Prior to Admission medications   Medication Sig Start Date End Date Taking? Authorizing  Provider  ALPRAZolam (XANAX) 0.5 MG tablet Take 0.25 mg by mouth 2 (two) times daily as needed for Sleep Down to 0.25mg     [provider]  amLODipine (NORVASC) 5 MG tablet Take 5 mg by mouth daily. 08/05/16   [provider]  amphetamine-dextroamphetamine (ADDERALL) 10 MG tablet SMARTSIG:0.5-1 Tablet(s) By Mouth Every Evening 02/18/23   [provider]  ARIPiprazole (ABILIFY) 2 MG tablet Take 1 tablet by mouth daily.    [provider]  Bacillus Coagulans-Inulin (PROBIOTIC) 1-250 BILLION-MG CAPS  07/01/16   [provider]  busPIRone (BUSPAR) 15 MG tablet SMARTSIG:1 Tablet(s) By Mouth 1-3 Times Daily PRN 03/08/23   [provider]  Cholecalciferol (VITAMIN D-3) 25 MCG (1000 UT) CAPS  07/01/17   [provider]  cloNIDine (CATAPRES) 0.1 MG tablet Take 0.4 mg by mouth.     [provider]  cloNIDine HCl (KAPVAY) 0.1 MG TB12 ER tablet Take 0.4 mg by mouth every morning. 05/01/19   [provider]  cyclobenzaprine (FLEXERIL) 5 MG tablet Take by mouth. 01/23/23   [provider]  desmopressin (DDAVP) 0.2 MG tablet 1 tab as needed at bedtime 05/19/19   Stoioff, Scott C, MD  escitalopram (LEXAPRO) 5 MG tablet Take 5 mg by mouth daily. 03/19/23   [provider]  famciclovir (FAMVIR) 500 MG tablet Take 500 mg by mouth  daily as needed. 02/28/23   [provider]  hydrOXYzine (ATARAX) 25 MG tablet 1/2-1 TABLET AS NEEDED FOR ACUTE ANXIETY. MAY INCREASE UP TO 2 TABLETS AS NEEDED FOR INSOMNIA    [provider]  lamoTRIgine (LAMICTAL) 150 MG tablet Take 150 mg by mouth 2 (two) times daily. 06/04/16   [provider]  loratadine (CLARITIN) 10 MG tablet Take 10 mg by mouth daily.    [provider]  Melatonin 10 MG TABS Take 1 tablet by mouth at bedtime as needed.    [provider]  meloxicam (MOBIC) 15 MG tablet Take 1 tablet by mouth daily. 03/01/22   [provider]   Multiple Vitamin (MULTIVITAMIN) tablet Take 1 tablet by mouth daily.    [provider]  Omega-3 Fatty Acids (RA FISH OIL) 1000 MG CAPS Take by mouth. 07/01/16   [provider]  omeprazole (PRILOSEC) 20 MG capsule Take 20 mg by mouth daily.    [provider]  pramipexole (MIRAPEX) 0.25 MG tablet PLEASE SEE ATTACHED FOR DETAILED DIRECTIONS    [provider]  tadalafil (CIALIS) 5 MG tablet Take 5 mg by mouth daily. 03/19/23   [provider]  Theanine 200 MG CAPS     [provider]  valACYclovir (VALTREX) 1000 MG tablet 2 po x 1, repeat in 12 hrs 04/21/18   [provider]  cetirizine (ZYRTEC) 10 MG tablet Take 10 mg by mouth daily.  02/20/19  [provider]  guanFACINE (INTUNIV) 4 MG TB24 ER tablet Take 4 mg by mouth at bedtime. 10/29/16 02/20/19  [provider]  metFORMIN (GLUCOPHAGE) 1000 MG tablet Take 1,000 mg by mouth daily with breakfast.  01/16/19  [provider]  QUEtiapine (SEROQUEL) 200 MG tablet Take 200 mg by mouth at bedtime.  01/16/19  [provider]    Family History Family History  Problem Relation Age of Onset   Skin cancer Mother    GER disease Mother    Skin cancer Father     Social History Social History   Tobacco Use   Smoking status: Never   Smokeless tobacco: Never  Vaping Use   Vaping status: Never Used  Substance Use Topics   Alcohol use: No   Drug use: No     Allergies   Erythromycin and Prednisone   Review of Systems Review of Systems   Physical Exam Triage Vital Signs ED Triage Vitals  Encounter Vitals Group     BP 06/06/23 1826 (!) 151/98     Systolic BP Percentile --      Diastolic BP Percentile --      Pulse Rate 06/06/23 1826 83     Resp 06/06/23 1826 16     Temp 06/06/23 1826 98.4 F (36.9 C)     Temp Source 06/06/23 1826 Oral     SpO2 06/06/23 1826 100 %     Weight 06/06/23 1822 177 lb 0.5 oz (80.3 kg)     Height 06/06/23 1822 5'  7" (1.702 m)     Head Circumference --      Peak Flow --      Pain Score 06/06/23 1822 0     Pain Loc --      Pain Education --      Exclude from Growth Chart --    No data found.  Updated Vital Signs BP (!) 151/98 (BP Location: Left Arm)   Pulse 83   Temp 98.4 F (36.9 C) (Oral)  Resp 16   Ht 5\' 7"  (1.702 m)   Wt 177 lb 0.5 oz (80.3 kg)   SpO2 100%   BMI 27.73 kg/m   Visual Acuity Right Eye Distance:   Left Eye Distance:   Bilateral Distance:    Right Eye Near:   Left Eye Near:    Bilateral Near:     Physical Exam Constitutional:      Appearance: Normal appearance.  Eyes:     Extraocular Movements: Extraocular movements intact.  Pulmonary:     Effort: Pulmonary effort is normal.  Genitourinary:    Comments: deferred Neurological:     Mental Status: He is alert and oriented to person, place, and time. Mental status is at baseline.      UC Treatments / Results  Labs (all labs ordered are listed, but only abnormal results are displayed) Labs Reviewed  HIV ANTIBODY (ROUTINE TESTING W REFLEX)  RPR  CYTOLOGY, (ORAL, ANAL, URETHRAL) ANCILLARY ONLY    EKG   Radiology No results found.  Procedures Procedures (including critical care time)  Medications Ordered in UC Medications - No data to display  Initial Impression / Assessment and Plan / UC Course  I have reviewed the triage vital signs and the nursing notes.  Pertinent labs & imaging results that were available during my care of the patient were reviewed by me and considered in my medical decision making (see chart for details).  Screening for STD   STI labs pending will treat per protocol, advised abstinence until lab results, and/or treatment is complete, advised condom use during all sexual encounters moving, may follow-up with urgent care as needed  Final Clinical Impressions(s) / UC Diagnoses   Final diagnoses:  Screen for STD (sexually transmitted disease)     Discharge  Instructions      Labs pending 2-3 days, you will be contacted if positive for any sti and treatment will be sent to the pharmacy, you will have to return to the clinic if positive for gonorrhea to receive treatment   Please refrain from having sex until labs results, if positive please refrain from having sex until treatment complete and symptoms resolve   If positive for HIV, Syphilis, Chlamydia  gonorrhea or trichomoniasis please notify partner or partners so they may tested as well   it is recommended you use some form of protection against the transmission of sti infections  such as condoms or dental dams with each sexual encounter     ED Prescriptions   None    PDMP not reviewed this encounter.   Valinda Hoar, NP 06/06/23 [phone]

## 2023-06-06 NOTE — ED Triage Notes (Signed)
Patient wants preventive STD testing including HIV. Patient last had testing in September 2024 and all was negative.  Patient denies any symptoms.

## 2023-06-06 NOTE — Discharge Instructions (Addendum)
Labs pending 2-3 days, you will be contacted if positive for any sti and treatment will be sent to the pharmacy, you will have to return to the clinic if positive for gonorrhea to receive treatment   Please refrain from having sex until labs results, if positive please refrain from having sex until treatment complete and symptoms resolve   If positive for HIV, Syphilis, Chlamydia  gonorrhea or trichomoniasis please notify partner or partners so they may tested as well   it is recommended you use some form of protection against the transmission of sti infections  such as condoms or dental dams with each sexual encounter

## 2023-06-07 LAB — RPR: RPR Ser Ql: NONREACTIVE

## 2023-06-09 LAB — CYTOLOGY, (ORAL, ANAL, URETHRAL) ANCILLARY ONLY
Chlamydia: NEGATIVE
Comment: NEGATIVE
Comment: NEGATIVE
Comment: NORMAL
Neisseria Gonorrhea: NEGATIVE
Trichomonas: NEGATIVE

## 2023-12-23 ENCOUNTER — Encounter: Payer: Self-pay | Admitting: Gastroenterology

## 2024-01-12 ENCOUNTER — Ambulatory Visit
Admission: RE | Admit: 2024-01-12 | Discharge: 2024-01-12 | Disposition: A | Discharge status: Home / Self Care | Attending: Gastroenterology | Admitting: Gastroenterology

## 2024-01-12 ENCOUNTER — Ambulatory Visit: Admitting: Anesthesiology

## 2024-01-12 ENCOUNTER — Encounter: Payer: Self-pay | Admitting: Gastroenterology

## 2024-01-12 ENCOUNTER — Encounter
Admission: RE | Disposition: A | Discharge status: Home / Self Care | Payer: Self-pay | Source: Home / Self Care | Attending: Gastroenterology

## 2024-01-12 ENCOUNTER — Other Ambulatory Visit: Payer: Self-pay

## 2024-01-12 DIAGNOSIS — I10 Essential (primary) hypertension: Secondary | ICD-10-CM | POA: Insufficient documentation

## 2024-01-12 DIAGNOSIS — D124 Benign neoplasm of descending colon: Secondary | ICD-10-CM | POA: Insufficient documentation

## 2024-01-12 DIAGNOSIS — G4730 Sleep apnea, unspecified: Secondary | ICD-10-CM | POA: Insufficient documentation

## 2024-01-12 DIAGNOSIS — K640 First degree hemorrhoids: Secondary | ICD-10-CM | POA: Insufficient documentation

## 2024-01-12 DIAGNOSIS — Z1211 Encounter for screening for malignant neoplasm of colon: Secondary | ICD-10-CM | POA: Insufficient documentation

## 2024-01-12 DIAGNOSIS — K635 Polyp of colon: Secondary | ICD-10-CM | POA: Diagnosis not present

## 2024-01-12 HISTORY — PX: COLONOSCOPY: SHX5424

## 2024-01-12 HISTORY — DX: Allergic rhinitis, unspecified: J30.9

## 2024-01-12 HISTORY — PX: POLYPECTOMY: SHX149

## 2024-01-12 HISTORY — DX: Attention-deficit hyperactivity disorder, unspecified type: F90.9

## 2024-01-12 SURGERY — COLONOSCOPY
Anesthesia: General

## 2024-01-12 MED ORDER — LIDOCAINE HCL (CARDIAC) PF 100 MG/5ML IV SOSY
PREFILLED_SYRINGE | INTRAVENOUS | Status: DC | PRN
  Administered 2024-01-12: 80 mg via INTRAVENOUS

## 2024-01-12 MED ORDER — DEXMEDETOMIDINE HCL IN NACL 80 MCG/20ML IV SOLN
INTRAVENOUS | Status: DC | PRN
  Administered 2024-01-12: 20 ug via INTRAVENOUS

## 2024-01-12 MED ORDER — PROPOFOL 10 MG/ML IV BOLUS
INTRAVENOUS | Status: DC | PRN
  Administered 2024-01-12 (×2): 50 mg via INTRAVENOUS

## 2024-01-12 MED ORDER — SODIUM CHLORIDE 0.9 % IV SOLN: INTRAVENOUS | Status: DC

## 2024-01-12 MED ORDER — LIDOCAINE HCL (PF) 2 % IJ SOLN
INTRAMUSCULAR | Status: AC
  Filled 2024-01-12: qty 5

## 2024-01-12 MED ORDER — PROPOFOL 500 MG/50ML IV EMUL
INTRAVENOUS | Status: DC | PRN
  Administered 2024-01-12: 125 ug/kg/min via INTRAVENOUS

## 2024-01-12 NOTE — Op Note (Signed)
 [facility] Patient Name: Robert Carson Procedure Date: 01/12/2024 11:30 AM MRN: [id number] Account #: [id number] Date of Birth: [birth year] Admit Type: Outpatient Age: 43 Room: [facility] ENDO ROOM 1 Gender: Male Note Status: Finalized Instrument Name: Colonoscope [id number] Procedure:             Colonoscopy Indications:           High risk colon cancer surveillance: Personal history                         of colonic polyps Providers:             Elspeth Ozell Jungling DO, DO Medicines:             Monitored Anesthesia Care Complications:         No immediate complications. Estimated blood loss:                         Minimal. Procedure:             Pre-Anesthesia Assessment:                        - Prior to the procedure, a History and Physical was                         performed, and patient medications and allergies were                         reviewed. The patient is competent. The risks and                         benefits of the procedure and the sedation options and                         risks were discussed with the patient. All questions                         were answered and informed consent was obtained.                         Patient identification and proposed procedure were                         verified by the physician, the nurse, the anesthetist                         and the technician in the endoscopy suite. Mental                         Status Examination: alert and oriented. Airway                         Examination: normal oropharyngeal airway and neck                         mobility. Respiratory Examination: clear to                         auscultation. CV Examination: RRR, no murmurs, no S3  or S4. Prophylactic Antibiotics: The patient does not                         require prophylactic antibiotics. Prior                         Anticoagulants: The patient has taken no anticoagulant                          or antiplatelet agents. ASA Grade Assessment: II - A                         patient with mild systemic disease. After reviewing                         the risks and benefits, the patient was deemed in                         satisfactory condition to undergo the procedure. The                         anesthesia plan was to use monitored anesthesia care                         (MAC). Immediately prior to administration of                         medications, the patient was re-assessed for adequacy                         to receive sedatives. The heart rate, respiratory                         rate, oxygen saturations, blood pressure, adequacy of                         pulmonary ventilation, and response to care were                         monitored throughout the procedure. The physical                         status of the patient was re-assessed after the                         procedure.                        After obtaining informed consent, the colonoscope was                         passed under direct vision. Throughout the procedure,                         the patient's blood pressure, pulse, and oxygen                         saturations were monitored continuously. The  Colonoscope was introduced through the anus and                         advanced to the the cecum, identified by appendiceal                         orifice and ileocecal valve. The colonoscopy was                         performed without difficulty. The patient tolerated                         the procedure well. The quality of the bowel                         preparation was evaluated using the BBPS [facility] Bowel                         Preparation Scale) with scores of: Right Colon = 3,                         Transverse Colon = 3 and Left Colon = 3 (entire mucosa                         seen well with no residual staining, small fragments                          of stool or opaque liquid). The total BBPS score                         equals 9. The ileocecal valve, appendiceal orifice,                         and rectum were photographed. Findings:      The perianal and digital rectal examinations were normal. Pertinent       negatives include normal sphincter tone.      Two sessile polyps were found in the descending colon and ascending       colon. The polyps were 1 to 2 mm in size. These polyps were removed with       a jumbo cold forceps. Resection and retrieval were complete. Estimated       blood loss was minimal.      Non-bleeding internal hemorrhoids were found during retroflexion. The       hemorrhoids were Grade I (internal hemorrhoids that do not prolapse).       Estimated blood loss: none.      The exam was otherwise without abnormality on direct and retroflexion       views. Impression:            - Two 1 to 2 mm polyps in the descending colon and in                         the ascending colon, removed with a jumbo cold                         forceps. Resected and retrieved.                        -  Non-bleeding internal hemorrhoids.                        - The examination was otherwise normal on direct and                         retroflexion views. Recommendation:        - Patient has a contact number available for                         emergencies. The signs and symptoms of potential                         delayed complications were discussed with the patient.                         Return to normal activities tomorrow. Written                         discharge instructions were provided to the patient.                        - Resume previous diet.                        - Continue present medications.                        - Await pathology results.                        - Repeat colonoscopy for surveillance based on                         pathology results.                        - Return to referring physician as  previously                         scheduled.                        - The findings and recommendations were discussed with                         the patient. Procedure Code(s):     --- Professional ---                        [phone], Colonoscopy, flexible; with biopsy, single or                         multiple Diagnosis Code(s):     --- Professional ---                        Z86.010, Personal history of colonic polyps                        D12.4, Benign neoplasm of descending colon  D12.2, Benign neoplasm of ascending colon                        K64.0, First degree hemorrhoids CPT copyright 2022 American [employer] Association. All rights reserved. The codes documented in this report are preliminary and upon coder review may  be revised to meet current compliance requirements. Attending Participation:      I personally performed the entire procedure. Elspeth Jungling, DO Elspeth Ozell Jungling DO, DO 01/12/2024 12:20:27 PM This report has been signed electronically. Number of Addenda: 0 Note Initiated On: 01/12/2024 11:30 AM Scope Withdrawal Time: 0 hours 13 minutes 17 seconds  Total Procedure Duration: 0 hours 20 minutes 39 seconds  Estimated Blood Loss:  Estimated blood loss was minimal.      [facility]

## 2024-01-12 NOTE — H&P (Signed)
 Pre-Procedure H&P   Patient ID: Robert Carson is a 43 y.o. male.  Gastroenterology Provider: Elspeth Ozell Jungling, DO  Referring Provider: Dr. Valora PCP: Valora Agent, MD  Date: 01/12/2024  HPI Robert Carson is a 43 y.o. male who presents today for Colonoscopy for Personal history of colon polyps.  No fhx of colon polyps/crc.  Last CSY 11/2017- tax2; bx negative for microscopic colitis History of hemorrhoids and diverticulosis  BM 2x/week.  Hemoglobin 14.7 MCV 86 platelets 292,000 iron saturation 36%   Past Medical History:  Diagnosis Date   ADHD (attention deficit hyperactivity disorder)    Allergic rhinitis    Anxiety    Bipolar 1 disorder (HCC)    BPH (benign prostatic hyperplasia)    Chickenpox    Colon polyp    Depression    Diverticulosis    Erectile dysfunction    GERD (gastroesophageal reflux disease)    Hemorrhoids    Hiatal hernia    History of kidney stones    Hypertension    Kidney stones    Mechanical low back pain    Mild sleep apnea    Tubular adenoma     Past Surgical History:  Procedure Laterality Date   COLONOSCOPY     COLONOSCOPY WITH PROPOFOL  N/A 06/13/2017   Procedure: COLONOSCOPY WITH PROPOFOL ;  Surgeon: Gaylyn Gladis PENNER, MD;  Location: [facility] ENDOSCOPY;  Service: Endoscopy;  Laterality: N/A;   COLONOSCOPY WITH PROPOFOL  N/A 12/08/2017   Procedure: COLONOSCOPY WITH PROPOFOL ;  Surgeon: Gaylyn Gladis PENNER, MD;  Location: [facility] ENDOSCOPY;  Service: Endoscopy;  Laterality: N/A;   cystoscopy     ESOPHAGOGASTRODUODENOSCOPY (EGD) WITH PROPOFOL  N/A 06/13/2017   Procedure: ESOPHAGOGASTRODUODENOSCOPY (EGD) WITH PROPOFOL ;  Surgeon: Gaylyn Gladis PENNER, MD;  Location: [facility] ENDOSCOPY;  Service: Endoscopy;  Laterality: N/A;   TONSILLECTOMY      Family History No h/o GI disease or malignancy  Review of Systems  Constitutional:  Negative for activity change, appetite change, chills, diaphoresis, fatigue, fever and unexpected weight change.   HENT:  Negative for trouble swallowing and voice change.   Respiratory:  Negative for shortness of breath and wheezing.   Cardiovascular:  Negative for chest pain, palpitations and leg swelling.  Gastrointestinal:  Positive for constipation. Negative for abdominal distention, abdominal pain, anal bleeding, blood in stool, diarrhea, nausea and vomiting.  Musculoskeletal:  Positive for back pain. Negative for arthralgias and myalgias.  Skin:  Negative for color change and pallor.  Neurological:  Negative for dizziness, syncope and weakness.  Psychiatric/Behavioral:  Negative for confusion. The patient is not nervous/anxious.   All other systems reviewed and are negative.    Medications No current facility-administered medications on file prior to encounter.   Current Outpatient Medications on File Prior to Encounter  Medication Sig Dispense Refill   amLODipine (NORVASC) 5 MG tablet Take 5 mg by mouth daily.     amphetamine-dextroamphetamine (ADDERALL) 10 MG tablet SMARTSIG:0.5-1 Tablet(s) By Mouth Every Evening     Bacillus Coagulans-Inulin (PROBIOTIC) 1-250 BILLION-MG CAPS      Cholecalciferol (VITAMIN D-3) 25 MCG (1000 UT) CAPS      famciclovir (FAMVIR) 500 MG tablet Take 500 mg by mouth daily as needed.     hydrOXYzine (ATARAX) 25 MG tablet 1/2-1 TABLET AS NEEDED FOR ACUTE ANXIETY. MAY INCREASE UP TO 2 TABLETS AS NEEDED FOR INSOMNIA     lamoTRIgine (LAMICTAL) 150 MG tablet Take 150 mg by mouth 2 (two) times daily.     loratadine (CLARITIN) 10  MG tablet Take 10 mg by mouth daily.     Multiple Vitamin (MULTIVITAMIN) tablet Take 1 tablet by mouth daily.     Omega-3 Fatty Acids (RA FISH OIL) 1000 MG CAPS Take by mouth.     omeprazole (PRILOSEC) 20 MG capsule Take 20 mg by mouth daily.     tadalafil (CIALIS) 5 MG tablet Take 5 mg by mouth daily.     ALPRAZolam (XANAX) 0.5 MG tablet Take 0.25 mg by mouth 2 (two) times daily as needed for Sleep Down to 0.25mg      ARIPiprazole (ABILIFY) 2 MG  tablet Take 1 tablet by mouth daily. (Patient not taking: Reported on 01/12/2024)     busPIRone (BUSPAR) 15 MG tablet SMARTSIG:1 Tablet(s) By Mouth 1-3 Times Daily PRN (Patient not taking: Reported on 01/12/2024)     cloNIDine (CATAPRES) 0.1 MG tablet Take 0.4 mg by mouth.  (Patient not taking: Reported on 01/12/2024)     cloNIDine HCl (KAPVAY) 0.1 MG TB12 ER tablet Take 0.4 mg by mouth every morning.     cyclobenzaprine (FLEXERIL) 5 MG tablet Take by mouth. (Patient not taking: Reported on 01/12/2024)     desmopressin  (DDAVP ) 0.2 MG tablet 1 tab as needed at bedtime 30 tablet 0   escitalopram (LEXAPRO) 5 MG tablet Take 5 mg by mouth daily.     Melatonin 10 MG TABS Take 1 tablet by mouth at bedtime as needed.     meloxicam  (MOBIC ) 15 MG tablet Take 1 tablet by mouth daily.     pramipexole (MIRAPEX) 0.25 MG tablet PLEASE SEE ATTACHED FOR DETAILED DIRECTIONS     Theanine 200 MG CAPS      valACYclovir (VALTREX) 1000 MG tablet 2 po x 1, repeat in 12 hrs (Patient not taking: Reported on 01/12/2024)     [DISCONTINUED] cetirizine (ZYRTEC) 10 MG tablet Take 10 mg by mouth daily.     [DISCONTINUED] guanFACINE (INTUNIV) 4 MG TB24 ER tablet Take 4 mg by mouth at bedtime.  1   [DISCONTINUED] metFORMIN (GLUCOPHAGE) 1000 MG tablet Take 1,000 mg by mouth daily with breakfast.     [DISCONTINUED] QUEtiapine (SEROQUEL) 200 MG tablet Take 200 mg by mouth at bedtime.      Pertinent medications related to GI and procedure were reviewed by me with the patient prior to the procedure   Current Facility-Administered Medications:    0.9 %  sodium chloride  infusion, , Intravenous, Continuous, Onita Elspeth Sharper, DO, Last Rate: 20 mL/hr at 01/12/24 1049, New Bag at 01/12/24 1049  sodium chloride  20 mL/hr at 01/12/24 1049       Allergies  Allergen Reactions   Erythromycin    Prednisone Other (See Comments)    Pt becomes irritable   Allergies were reviewed by me prior to the procedure  Objective   Body mass  index is 28.29 kg/m. Vitals:   01/12/24 1046  BP: (!) 139/103  Pulse: (!) 109  Resp: 14  Temp: (!) 97.1 F (36.2 C)  TempSrc: Temporal  SpO2: 99%  Weight: 81.9 kg  Height: 5' 7 (1.702 m)     Physical Exam Vitals and nursing note reviewed.  Constitutional:      General: He is not in acute distress.    Appearance: Normal appearance. He is not ill-appearing, toxic-appearing or diaphoretic.  HENT:     Head: Normocephalic and atraumatic.     Nose: Nose normal.     Mouth/Throat:     Mouth: Mucous membranes are moist.  Pharynx: Oropharynx is clear.  Eyes:     General: No scleral icterus.    Extraocular Movements: Extraocular movements intact.  Cardiovascular:     Rate and Rhythm: Regular rhythm. Tachycardia present.     Heart sounds: Normal heart sounds. No murmur heard.    No friction rub. No gallop.  Pulmonary:     Effort: Pulmonary effort is normal. No respiratory distress.     Breath sounds: Normal breath sounds. No wheezing, rhonchi or rales.  Abdominal:     General: Bowel sounds are normal. There is no distension.     Palpations: Abdomen is soft.     Tenderness: There is no abdominal tenderness. There is no guarding or rebound.  Musculoskeletal:     Cervical back: Neck supple.     Right lower leg: No edema.     Left lower leg: No edema.  Skin:    General: Skin is warm and dry.     Coloration: Skin is not jaundiced or pale.  Neurological:     General: No focal deficit present.     Mental Status: He is alert and oriented to person, place, and time. Mental status is at baseline.  Psychiatric:        Mood and Affect: Mood normal.        Behavior: Behavior normal.        Thought Content: Thought content normal.        Judgment: Judgment normal.      Assessment:  Mr. Robert Carson is a 42 y.o. male  who presents today for Colonoscopy for Personal history of colon polyps .  Plan:  Colonoscopy with possible intervention today  Colonoscopy with possible  biopsy, control of bleeding, polypectomy, and interventions as necessary has been discussed with the patient/patient representative. Informed consent was obtained from the patient/patient representative after explaining the indication, nature, and risks of the procedure including but not limited to death, bleeding, perforation, missed neoplasm/lesions, cardiorespiratory compromise, and reaction to medications. Opportunity for questions was given and appropriate answers were provided. Patient/patient representative has verbalized understanding is amenable to undergoing the procedure.   Elspeth Ozell Jungling, DO  [facility]  Portions of the record may have been created with voice recognition software. Occasional wrong-word or 'sound-a-like' substitutions may have occurred due to the inherent limitations of voice recognition software.  Read the chart carefully and recognize, using context, where substitutions may have occurred.

## 2024-01-12 NOTE — Anesthesia Preprocedure Evaluation (Signed)
 Anesthesia Evaluation  Patient identified by MRN, date of birth, ID band Patient awake    Reviewed: Allergy & Precautions, NPO status , Patient's Chart, lab work & pertinent test results  History of Anesthesia Complications Negative for: history of anesthetic complications  Airway Mallampati: III  TM Distance: >3 FB Neck ROM: Full    Dental  (+) Dental Advidsory Given, Teeth Intact   Pulmonary neg pulmonary ROS, neg shortness of breath, neg sleep apnea, neg COPD, neg recent URI   breath sounds clear to auscultation- rhonchi (-) wheezing      Cardiovascular hypertension, Pt. on medications (-) angina (-) CAD, (-) Past MI and (-) Cardiac Stents (-) dysrhythmias (-) Valvular Problems/Murmurs Rhythm:Regular Rate:Normal - Systolic murmurs and - Diastolic murmurs    Neuro/Psych  PSYCHIATRIC DISORDERS Anxiety Depression Bipolar Disorder   negative neurological ROS     GI/Hepatic Neg liver ROS, hiatal hernia,GERD  ,,  Endo/Other  negative endocrine ROSneg diabetes    Renal/GU Renal disease (hx of nephrolithiasis)  negative genitourinary   Musculoskeletal negative musculoskeletal ROS (+)    Abdominal  (+) + obese  Peds  Hematology negative hematology ROS (+)   Anesthesia Other Findings Past Medical History: No date: Anxiety No date: Bipolar 1 disorder (HCC) No date: Chickenpox No date: Colon polyp No date: Depression No date: Diverticulosis No date: Erectile dysfunction No date: GERD (gastroesophageal reflux disease) No date: Hemorrhoids No date: Hiatal hernia No date: Hypertension No date: Kidney stones No date: Mechanical low back pain No date: Mild sleep apnea No date: Tubular adenoma   Reproductive/Obstetrics                              Anesthesia Physical Anesthesia Plan  ASA: 2  Anesthesia Plan: General   Post-op Pain Management:    Induction: Intravenous  PONV Risk Score  and Plan: 1 and Propofol  infusion, TIVA and Treatment may vary due to age or medical condition  Airway Management Planned: Natural Airway and Nasal Cannula  Additional Equipment:   Intra-op Plan:   Post-operative Plan:   Informed Consent: I have reviewed the patients History and Physical, chart, labs and discussed the procedure including the risks, benefits and alternatives for the proposed anesthesia with the patient or authorized representative who has indicated his/her understanding and acceptance.     Dental advisory given  Plan Discussed with: CRNA and Anesthesiologist  Anesthesia Plan Comments:          Anesthesia Quick Evaluation

## 2024-01-12 NOTE — Interval H&P Note (Signed)
 History and Physical Interval Note: Preprocedure H&P from 01/12/24  was reviewed and there was no interval change after seeing and examining the patient.  Written consent was obtained from the patient after discussion of risks, benefits, and alternatives. Patient has consented to proceed with Colonoscopy with possible intervention   01/12/2024 11:39 AM  Robert Carson  has presented today for surgery, with the diagnosis of History of colon polyps (Z86.0100).  The various methods of treatment have been discussed with the patient and family. After consideration of risks, benefits and other options for treatment, the patient has consented to  Procedure(s): COLONOSCOPY (N/A) as a surgical intervention.  The patient's history has been reviewed, patient examined, no change in status, stable for surgery.  I have reviewed the patient's chart and labs.  Questions were answered to the patient's satisfaction.     Elspeth Ozell Jungling

## 2024-01-12 NOTE — Transfer of Care (Signed)
 Immediate Anesthesia Transfer of Care Note  Patient: Robert Carson  Procedure(s) Performed: COLONOSCOPY POLYPECTOMY, INTESTINE  Patient Location: PACU  Anesthesia Type:General  Level of Consciousness: sedated  Airway & Oxygen Therapy: Patient Spontanous Breathing  Post-op Assessment: Report given to RN and Post -op Vital signs reviewed and stable  Post vital signs: Reviewed and stable  Last Vitals:  Vitals Value Taken Time  BP 95/66 01/12/24 12:20  Temp    Pulse 80 01/12/24 12:20  Resp 17 01/12/24 12:20  SpO2 96 % 01/12/24 12:20  Vitals shown include unfiled device data.  Last Pain:  Vitals:   01/12/24 1046  TempSrc: Temporal  PainSc: 6       Patients Stated Pain Goal: 0 (01/12/24 1046)  Complications: No notable events documented.

## 2024-01-13 LAB — SURGICAL PATHOLOGY

## 2024-01-13 NOTE — Anesthesia Postprocedure Evaluation (Signed)
 Anesthesia Post Note  Patient: DEVAUN HERNANDEZ  Procedure(s) Performed: COLONOSCOPY POLYPECTOMY, INTESTINE  Patient location during evaluation: Endoscopy Anesthesia Type: General Level of consciousness: awake and alert Pain management: pain level controlled Vital Signs Assessment: post-procedure vital signs reviewed and stable Respiratory status: spontaneous breathing, nonlabored ventilation, respiratory function stable and patient connected to nasal cannula oxygen Cardiovascular status: blood pressure returned to baseline and stable Postop Assessment: no apparent nausea or vomiting Anesthetic complications: no   No notable events documented.   Last Vitals:  Vitals:   01/12/24 1230 01/12/24 1240  BP: 114/65 120/73  Pulse:    Resp:    Temp:    SpO2:      Last Pain:  Vitals:   01/12/24 1240  TempSrc:   PainSc: 1                  Prentice Murphy
